# Patient Record
Sex: Female | Born: 1990 | Race: White | Hispanic: No | Marital: Single | Smoking: Never smoker
Health system: Southern US, Community
[De-identification: ages and names within clinical notes are randomized; demographics above are authoritative.]

## PROBLEM LIST (undated history)

## (undated) DIAGNOSIS — S069X9A Unspecified intracranial injury with loss of consciousness of unspecified duration, initial encounter: Secondary | ICD-10-CM

## (undated) DIAGNOSIS — Z8781 Personal history of (healed) traumatic fracture: Secondary | ICD-10-CM

## (undated) DIAGNOSIS — S069XAA Unspecified intracranial injury with loss of consciousness status unknown, initial encounter: Secondary | ICD-10-CM

## (undated) DIAGNOSIS — Z0000 Encounter for general adult medical examination without abnormal findings: Secondary | ICD-10-CM

## (undated) DIAGNOSIS — F909 Attention-deficit hyperactivity disorder, unspecified type: Secondary | ICD-10-CM

---
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
## 2011-06-09 ENCOUNTER — Other Ambulatory Visit: Payer: Self-pay | Admitting: Gastroenterology

## 2011-06-13 ENCOUNTER — Ambulatory Visit
Admission: RE | Admit: 2011-06-13 | Discharge: 2011-06-13 | Disposition: A | Discharge status: Home / Self Care | Payer: BC Managed Care – PPO | Source: Ambulatory Visit | Attending: Gastroenterology | Admitting: Gastroenterology

## 2019-11-16 ENCOUNTER — Emergency Department (HOSPITAL_COMMUNITY): Payer: BC Managed Care – PPO

## 2019-11-16 ENCOUNTER — Inpatient Hospital Stay (HOSPITAL_COMMUNITY)
Admission: EM | Admit: 2019-11-16 | Discharge: 2019-11-27 | DRG: 955 | Disposition: A | Discharge status: Rehabilitation Facility | Payer: BC Managed Care – PPO | Attending: Surgery | Admitting: Surgery

## 2019-11-16 DIAGNOSIS — S0181XA Laceration without foreign body of other part of head, initial encounter: Secondary | ICD-10-CM | POA: Diagnosis present

## 2019-11-16 DIAGNOSIS — S0219XA Other fracture of base of skull, initial encounter for closed fracture: Secondary | ICD-10-CM | POA: Diagnosis present

## 2019-11-16 DIAGNOSIS — S064XAA Epidural hemorrhage with loss of consciousness status unknown, initial encounter: Secondary | ICD-10-CM

## 2019-11-16 DIAGNOSIS — S0281XA Fracture of other specified skull and facial bones, right side, initial encounter for closed fracture: Secondary | ICD-10-CM | POA: Diagnosis present

## 2019-11-16 DIAGNOSIS — G9389 Other specified disorders of brain: Secondary | ICD-10-CM | POA: Diagnosis present

## 2019-11-16 DIAGNOSIS — T1490XA Injury, unspecified, initial encounter: Secondary | ICD-10-CM

## 2019-11-16 DIAGNOSIS — F09 Unspecified mental disorder due to known physiological condition: Secondary | ICD-10-CM | POA: Diagnosis not present

## 2019-11-16 DIAGNOSIS — S062X9A Diffuse traumatic brain injury with loss of consciousness of unspecified duration, initial encounter: Secondary | ICD-10-CM

## 2019-11-16 DIAGNOSIS — R451 Restlessness and agitation: Secondary | ICD-10-CM

## 2019-11-16 DIAGNOSIS — T07XXXA Unspecified multiple injuries, initial encounter: Secondary | ICD-10-CM | POA: Diagnosis not present

## 2019-11-16 DIAGNOSIS — Z298 Encounter for other specified prophylactic measures: Secondary | ICD-10-CM

## 2019-11-16 DIAGNOSIS — J9600 Acute respiratory failure, unspecified whether with hypoxia or hypercapnia: Secondary | ICD-10-CM

## 2019-11-16 DIAGNOSIS — S064X0A Epidural hemorrhage without loss of consciousness, initial encounter: Secondary | ICD-10-CM | POA: Diagnosis present

## 2019-11-16 DIAGNOSIS — T797XXA Traumatic subcutaneous emphysema, initial encounter: Secondary | ICD-10-CM | POA: Diagnosis present

## 2019-11-16 DIAGNOSIS — R402222 Coma scale, best verbal response, incomprehensible words, at arrival to emergency department: Secondary | ICD-10-CM | POA: Diagnosis present

## 2019-11-16 DIAGNOSIS — S061X0A Traumatic cerebral edema without loss of consciousness, initial encounter: Principal | ICD-10-CM | POA: Diagnosis present

## 2019-11-16 DIAGNOSIS — S065X0A Traumatic subdural hemorrhage without loss of consciousness, initial encounter: Secondary | ICD-10-CM | POA: Diagnosis present

## 2019-11-16 DIAGNOSIS — S0292XD Unspecified fracture of facial bones, subsequent encounter for fracture with routine healing: Secondary | ICD-10-CM | POA: Diagnosis not present

## 2019-11-16 DIAGNOSIS — Y92410 Unspecified street and highway as the place of occurrence of the external cause: Secondary | ICD-10-CM | POA: Diagnosis not present

## 2019-11-16 DIAGNOSIS — S06820A Injury of left internal carotid artery, intracranial portion, not elsewhere classified without loss of consciousness, initial encounter: Secondary | ICD-10-CM | POA: Diagnosis present

## 2019-11-16 DIAGNOSIS — S06810A Injury of right internal carotid artery, intracranial portion, not elsewhere classified without loss of consciousness, initial encounter: Secondary | ICD-10-CM | POA: Diagnosis present

## 2019-11-16 DIAGNOSIS — S2231XA Fracture of one rib, right side, initial encounter for closed fracture: Secondary | ICD-10-CM | POA: Diagnosis present

## 2019-11-16 DIAGNOSIS — D696 Thrombocytopenia, unspecified: Secondary | ICD-10-CM | POA: Diagnosis not present

## 2019-11-16 DIAGNOSIS — S022XXA Fracture of nasal bones, initial encounter for closed fracture: Secondary | ICD-10-CM | POA: Diagnosis present

## 2019-11-16 DIAGNOSIS — S02412A LeFort II fracture, initial encounter for closed fracture: Secondary | ICD-10-CM | POA: Diagnosis present

## 2019-11-16 DIAGNOSIS — S27322A Contusion of lung, bilateral, initial encounter: Secondary | ICD-10-CM | POA: Diagnosis present

## 2019-11-16 DIAGNOSIS — D62 Acute posthemorrhagic anemia: Secondary | ICD-10-CM | POA: Diagnosis not present

## 2019-11-16 DIAGNOSIS — Z2989 Encounter for other specified prophylactic measures: Secondary | ICD-10-CM

## 2019-11-16 DIAGNOSIS — S15091A Other specified injury of right carotid artery, initial encounter: Secondary | ICD-10-CM | POA: Diagnosis not present

## 2019-11-16 DIAGNOSIS — F419 Anxiety disorder, unspecified: Secondary | ICD-10-CM | POA: Diagnosis present

## 2019-11-16 DIAGNOSIS — G8918 Other acute postprocedural pain: Secondary | ICD-10-CM | POA: Diagnosis not present

## 2019-11-16 DIAGNOSIS — S0292XA Unspecified fracture of facial bones, initial encounter for closed fracture: Secondary | ICD-10-CM | POA: Diagnosis not present

## 2019-11-16 DIAGNOSIS — S020XXA Fracture of vault of skull, initial encounter for closed fracture: Secondary | ICD-10-CM | POA: Diagnosis present

## 2019-11-16 DIAGNOSIS — S069X0S Unspecified intracranial injury without loss of consciousness, sequela: Secondary | ICD-10-CM | POA: Diagnosis not present

## 2019-11-16 DIAGNOSIS — R4587 Impulsiveness: Secondary | ICD-10-CM | POA: Diagnosis present

## 2019-11-16 DIAGNOSIS — S062X9S Diffuse traumatic brain injury with loss of consciousness of unspecified duration, sequela: Secondary | ICD-10-CM | POA: Diagnosis not present

## 2019-11-16 DIAGNOSIS — Z23 Encounter for immunization: Secondary | ICD-10-CM | POA: Diagnosis not present

## 2019-11-16 DIAGNOSIS — R402112 Coma scale, eyes open, never, at arrival to emergency department: Secondary | ICD-10-CM | POA: Diagnosis present

## 2019-11-16 DIAGNOSIS — Z20822 Contact with and (suspected) exposure to covid-19: Secondary | ICD-10-CM | POA: Diagnosis present

## 2019-11-16 DIAGNOSIS — S064X9A Epidural hemorrhage with loss of consciousness of unspecified duration, initial encounter: Secondary | ICD-10-CM

## 2019-11-16 DIAGNOSIS — S15092A Other specified injury of left carotid artery, initial encounter: Secondary | ICD-10-CM | POA: Diagnosis not present

## 2019-11-16 DIAGNOSIS — S0292XS Unspecified fracture of facial bones, sequela: Secondary | ICD-10-CM | POA: Diagnosis not present

## 2019-11-16 DIAGNOSIS — Z978 Presence of other specified devices: Secondary | ICD-10-CM

## 2019-11-16 DIAGNOSIS — R402352 Coma scale, best motor response, localizes pain, at arrival to emergency department: Secondary | ICD-10-CM | POA: Diagnosis present

## 2019-11-16 DIAGNOSIS — R4689 Other symptoms and signs involving appearance and behavior: Secondary | ICD-10-CM | POA: Diagnosis not present

## 2019-11-16 LAB — I-STAT CHEM 8, ED
BUN: 11 mg/dL (ref 6–20)
Calcium, Ion: 1.13 mmol/L — ABNORMAL LOW (ref 1.15–1.40)
Chloride: 107 mmol/L (ref 98–111)
Creatinine, Ser: 0.6 mg/dL (ref 0.44–1.00)
Glucose, Bld: 153 mg/dL — ABNORMAL HIGH (ref 70–99)
HCT: 38 % (ref 36.0–46.0)
Hemoglobin: 12.9 g/dL (ref 12.0–15.0)
Potassium: 3.2 mmol/L — ABNORMAL LOW (ref 3.5–5.1)
Sodium: 139 mmol/L (ref 135–145)
TCO2: 23 mmol/L (ref 22–32)

## 2019-11-16 LAB — COMPREHENSIVE METABOLIC PANEL
ALT: 20 U/L (ref 0–44)
AST: 30 U/L (ref 15–41)
Albumin: 3.8 g/dL (ref 3.5–5.0)
Alkaline Phosphatase: 49 U/L (ref 38–126)
Anion gap: 11 (ref 5–15)
BUN: 11 mg/dL (ref 6–20)
CO2: 19 mmol/L — ABNORMAL LOW (ref 22–32)
Calcium: 9.2 mg/dL (ref 8.9–10.3)
Chloride: 108 mmol/L (ref 98–111)
Creatinine, Ser: 0.75 mg/dL (ref 0.44–1.00)
GFR calc Af Amer: >60 mL/min (ref >60)
GFR calc non Af Amer: >60 mL/min (ref >60)
Glucose, Bld: 160 mg/dL — ABNORMAL HIGH (ref 70–99)
Potassium: 3.1 mmol/L — ABNORMAL LOW (ref 3.5–5.1)
Sodium: 138 mmol/L (ref 135–145)
Total Bilirubin: 0.5 mg/dL (ref 0.3–1.2)
Total Protein: 6.8 g/dL (ref 6.5–8.1)

## 2019-11-16 LAB — SAMPLE TO BLOOD BANK

## 2019-11-16 LAB — CBC
HCT: 38.9 % (ref 36.0–46.0)
Hemoglobin: 13.3 g/dL (ref 12.0–15.0)
MCH: 29.8 pg (ref 26.0–34.0)
MCHC: 34.2 g/dL (ref 30.0–36.0)
MCV: 87.2 fL (ref 80.0–100.0)
Platelets: 226 10*3/uL (ref 150–400)
RBC: 4.46 MIL/uL (ref 3.87–5.11)
RDW: 12 % (ref 11.5–15.5)
WBC: 10.8 10*3/uL — ABNORMAL HIGH (ref 4.0–10.5)
nRBC: 0 % (ref 0.0–0.2)

## 2019-11-16 LAB — I-STAT BETA HCG BLOOD, ED (MC, WL, AP ONLY): I-stat hCG, quantitative: <5 m[IU]/mL (ref <5)

## 2019-11-16 LAB — PROTIME-INR
INR: 0.9 (ref 0.8–1.2)
Prothrombin Time: 11.7 seconds (ref 11.4–15.2)

## 2019-11-16 LAB — POCT I-STAT 7, (LYTES, BLD GAS, ICA,H+H)
Acid-base deficit: 3 mmol/L — ABNORMAL HIGH (ref 0.0–2.0)
Bicarbonate: 21.2 mmol/L (ref 20.0–28.0)
Calcium, Ion: 1.11 mmol/L — ABNORMAL LOW (ref 1.15–1.40)
HCT: 28 % — ABNORMAL LOW (ref 36.0–46.0)
Hemoglobin: 9.5 g/dL — ABNORMAL LOW (ref 12.0–15.0)
O2 Saturation: 100 %
Patient temperature: 98.6
Potassium: 3 mmol/L — ABNORMAL LOW (ref 3.5–5.1)
Sodium: 137 mmol/L (ref 135–145)
TCO2: 22 mmol/L (ref 22–32)
pCO2 arterial: 34.4 mmHg (ref 32.0–48.0)
pH, Arterial: 7.397 (ref 7.350–7.450)
pO2, Arterial: 466 mmHg — ABNORMAL HIGH (ref 83.0–108.0)

## 2019-11-16 LAB — RESPIRATORY PANEL BY RT PCR (FLU A&B, COVID)
Influenza A by PCR: NEGATIVE
Influenza B by PCR: NEGATIVE
SARS Coronavirus 2 by RT PCR: NEGATIVE

## 2019-11-16 LAB — LACTIC ACID, PLASMA: Lactic Acid, Venous: 1.4 mmol/L (ref 0.5–1.9)

## 2019-11-16 LAB — ETHANOL: Alcohol, Ethyl (B): <10 mg/dL (ref <10)

## 2019-11-16 LAB — CDS SEROLOGY

## 2019-11-16 MED ORDER — PROPOFOL 1000 MG/100ML IV EMUL
5.0000 ug/kg/min | INTRAVENOUS | Status: DC
  Administered 2019-11-17: 40 ug/kg/min via INTRAVENOUS
  Administered 2019-11-17: 50 ug/kg/min via INTRAVENOUS
  Filled 2019-11-16: qty 100

## 2019-11-16 MED ORDER — MIDAZOLAM HCL 2 MG/2ML IJ SOLN
2.0000 mg | INTRAMUSCULAR | Status: DC | PRN
  Filled 2019-11-16: qty 2

## 2019-11-16 MED ORDER — IOHEXOL 300 MG/ML  SOLN
100.0000 mL | Freq: Once | INTRAMUSCULAR | Status: AC | PRN
  Administered 2019-11-16: 100 mL via INTRAVENOUS

## 2019-11-16 MED ORDER — MIDAZOLAM HCL 2 MG/2ML IJ SOLN: 2.0000 mg | INTRAMUSCULAR | Status: DC | PRN

## 2019-11-16 MED ORDER — IOHEXOL 350 MG/ML SOLN
75.0000 mL | Freq: Once | INTRAVENOUS | Status: AC | PRN
  Administered 2019-11-16: 75 mL via INTRAVENOUS

## 2019-11-16 MED ORDER — LIDOCAINE-EPINEPHRINE (PF) 1 %-1:200000 IJ SOLN
20.0000 mL | Freq: Once | INTRAMUSCULAR | Status: DC
  Filled 2019-11-16: qty 20

## 2019-11-16 MED ORDER — FENTANYL CITRATE (PF) 100 MCG/2ML IJ SOLN
100.0000 ug | INTRAMUSCULAR | Status: DC | PRN
  Administered 2019-11-16 (×2): 100 ug via INTRAVENOUS
  Filled 2019-11-16: qty 2

## 2019-11-16 MED ORDER — FENTANYL CITRATE (PF) 100 MCG/2ML IJ SOLN
100.0000 ug | INTRAMUSCULAR | Status: DC | PRN
  Filled 2019-11-16: qty 2

## 2019-11-16 MED ORDER — MIDAZOLAM HCL 2 MG/2ML IJ SOLN
INTRAMUSCULAR | Status: AC
  Administered 2019-11-16: 1 mg
  Filled 2019-11-16: qty 2

## 2019-11-16 MED ORDER — FENTANYL CITRATE (PF) 100 MCG/2ML IJ SOLN
INTRAMUSCULAR | Status: AC
  Filled 2019-11-16: qty 2

## 2019-11-16 MED ORDER — SODIUM CHLORIDE 0.9 % IV SOLN
INTRAVENOUS | Status: AC | PRN
  Administered 2019-11-16: 1000 mL via INTRAVENOUS

## 2019-11-16 MED ORDER — SODIUM CHLORIDE 0.9 % IV SOLN
2.0000 g | Freq: Once | INTRAVENOUS | Status: AC
  Administered 2019-11-16: 2 g via INTRAVENOUS

## 2019-11-16 MED ORDER — LIDOCAINE-EPINEPHRINE (PF) 1.5 %-1:200000 IJ SOLN
20.0000 mL | Freq: Once | INTRAMUSCULAR | Status: AC
  Administered 2019-11-16: 20 mL
  Filled 2019-11-16: qty 30

## 2019-11-16 MED ORDER — TETANUS-DIPHTH-ACELL PERTUSSIS 5-2.5-18.5 LF-MCG/0.5 IM SUSP
0.5000 mL | Freq: Once | INTRAMUSCULAR | Status: AC
  Administered 2019-11-16: 0.5 mL via INTRAMUSCULAR

## 2019-11-16 MED ORDER — PROPOFOL 1000 MG/100ML IV EMUL
5.0000 ug/kg/min | INTRAVENOUS | Status: DC
  Administered 2019-11-16: 20 ug/kg/min via INTRAVENOUS
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
## 2019-11-16 NOTE — Progress Notes (Signed)
Orthopedic Tech Progress Note Patient Details:  Central Cc Doe 11/21/1875 [id number] TRAUMA lvl 2 Patient ID: Central Cc Doe, female   DOB: 11/21/1875, 28 y.o.   MRN: [id number]   Staci Righter 11/16/2019, 8:04 PM
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
## 2019-11-16 NOTE — ED Notes (Signed)
Portable chest bedside after intubation.

## 2019-11-16 NOTE — ED Notes (Signed)
Called Respiratory, she continues to be busy, will see if someone else is available.  Provider aware.

## 2019-11-16 NOTE — ED Notes (Signed)
POC Covid test error 19 test could not run. Sample bloody.
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
## 2019-11-16 NOTE — ED Notes (Signed)
RN, TRN and respiratory return from CT
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
## 2019-11-16 NOTE — ED Notes (Signed)
Requested secretary call for plastics tray from OR
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
## 2019-11-16 NOTE — ED Notes (Signed)
Called CT and respiratory, provider ordered new CT.  CT ready, waiting on respiratory to go to CT
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
## 2019-11-16 NOTE — ED Notes (Signed)
Patient transported to CT 
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
## 2019-11-17 ENCOUNTER — Inpatient Hospital Stay (HOSPITAL_COMMUNITY): Payer: BC Managed Care – PPO

## 2019-11-17 DIAGNOSIS — S15092A Other specified injury of left carotid artery, initial encounter: Secondary | ICD-10-CM

## 2019-11-17 DIAGNOSIS — S15091A Other specified injury of right carotid artery, initial encounter: Secondary | ICD-10-CM

## 2019-11-17 LAB — BASIC METABOLIC PANEL
Anion gap: 13 (ref 5–15)
BUN: 9 mg/dL (ref 6–20)
CO2: 18 mmol/L — ABNORMAL LOW (ref 22–32)
Calcium: 7.9 mg/dL — ABNORMAL LOW (ref 8.9–10.3)
Chloride: 108 mmol/L (ref 98–111)
Creatinine, Ser: 0.72 mg/dL (ref 0.44–1.00)
GFR calc Af Amer: >60 mL/min (ref >60)
GFR calc non Af Amer: >60 mL/min (ref >60)
Glucose, Bld: 99 mg/dL (ref 70–99)
Potassium: 3.7 mmol/L (ref 3.5–5.1)
Sodium: 139 mmol/L (ref 135–145)

## 2019-11-17 LAB — RAPID URINE DRUG SCREEN, HOSP PERFORMED
Amphetamines: NOT DETECTED
Barbiturates: NOT DETECTED
Benzodiazepines: POSITIVE — AB
Cocaine: NOT DETECTED
Opiates: NOT DETECTED
Tetrahydrocannabinol: NOT DETECTED

## 2019-11-17 LAB — CBC
HCT: 30.7 % — ABNORMAL LOW (ref 36.0–46.0)
Hemoglobin: 10.7 g/dL — ABNORMAL LOW (ref 12.0–15.0)
MCH: 30.5 pg (ref 26.0–34.0)
MCHC: 34.9 g/dL (ref 30.0–36.0)
MCV: 87.5 fL (ref 80.0–100.0)
Platelets: 139 10*3/uL — ABNORMAL LOW (ref 150–400)
RBC: 3.51 MIL/uL — ABNORMAL LOW (ref 3.87–5.11)
RDW: 12.2 % (ref 11.5–15.5)
WBC: 14.3 10*3/uL — ABNORMAL HIGH (ref 4.0–10.5)
nRBC: 0 % (ref 0.0–0.2)

## 2019-11-17 LAB — URINALYSIS, ROUTINE W REFLEX MICROSCOPIC
Bilirubin Urine: NEGATIVE
Glucose, UA: NEGATIVE mg/dL
Hgb urine dipstick: NEGATIVE
Ketones, ur: 5 mg/dL — AB
Leukocytes,Ua: NEGATIVE
Nitrite: NEGATIVE
Protein, ur: NEGATIVE mg/dL
Specific Gravity, Urine: 1.032 — ABNORMAL HIGH (ref 1.005–1.030)
pH: 6 (ref 5.0–8.0)

## 2019-11-17 LAB — HIV ANTIBODY (ROUTINE TESTING W REFLEX): HIV Screen 4th Generation wRfx: NONREACTIVE

## 2019-11-17 LAB — TRIGLYCERIDES: Triglycerides: 163 mg/dL — ABNORMAL HIGH (ref <150)

## 2019-11-17 MED ORDER — LACTATED RINGERS IV SOLN: INTRAVENOUS | Status: DC

## 2019-11-17 MED ORDER — CHLORHEXIDINE GLUCONATE 0.12% ORAL RINSE (MEDLINE KIT)
15.0000 mL | Freq: Two times a day (BID) | OROMUCOSAL | Status: DC
  Administered 2019-11-17 – 2019-11-18 (×3): 15 mL via OROMUCOSAL

## 2019-11-17 MED ORDER — ORAL CARE MOUTH RINSE
15.0000 mL | OROMUCOSAL | Status: DC
  Administered 2019-11-17 (×6): 15 mL via OROMUCOSAL

## 2019-11-17 MED ORDER — DOCUSATE SODIUM 50 MG/5ML PO LIQD: 100.0000 mg | Freq: Two times a day (BID) | ORAL | Status: DC | PRN

## 2019-11-17 MED ORDER — BISACODYL 10 MG RE SUPP: 10.0000 mg | Freq: Every day | RECTAL | Status: DC | PRN

## 2019-11-17 MED ORDER — FENTANYL BOLUS VIA INFUSION
50.0000 ug | INTRAVENOUS | Status: DC | PRN
  Filled 2019-11-17: qty 50

## 2019-11-17 MED ORDER — CHLORHEXIDINE GLUCONATE CLOTH 2 % EX PADS
6.0000 | MEDICATED_PAD | Freq: Every day | CUTANEOUS | Status: DC
  Administered 2019-11-18: 6 via TOPICAL

## 2019-11-17 MED ORDER — ONDANSETRON 4 MG PO TBDP: 4.0000 mg | ORAL_TABLET | Freq: Four times a day (QID) | ORAL | Status: DC | PRN

## 2019-11-17 MED ORDER — PROPOFOL 1000 MG/100ML IV EMUL: 0.0000 ug/kg/min | INTRAVENOUS | Status: DC

## 2019-11-17 MED ORDER — LEVETIRACETAM IN NACL 500 MG/100ML IV SOLN
500.0000 mg | Freq: Two times a day (BID) | INTRAVENOUS | Status: DC
  Administered 2019-11-17 – 2019-11-23 (×13): 500 mg via INTRAVENOUS
  Filled 2019-11-17 (×13): qty 100

## 2019-11-17 MED ORDER — METHOCARBAMOL 1000 MG/10ML IJ SOLN
1000.0000 mg | Freq: Three times a day (TID) | INTRAVENOUS | Status: DC
  Administered 2019-11-17 – 2019-11-23 (×18): 1000 mg via INTRAVENOUS
  Filled 2019-11-17 (×22): qty 10

## 2019-11-17 MED ORDER — MORPHINE SULFATE (PF) 4 MG/ML IV SOLN
4.0000 mg | INTRAVENOUS | Status: DC | PRN
  Administered 2019-11-19 – 2019-11-23 (×4): 4 mg via INTRAVENOUS
  Filled 2019-11-17 (×4): qty 1

## 2019-11-17 MED ORDER — ACETAMINOPHEN 160 MG/5ML PO SOLN
1000.0000 mg | Freq: Four times a day (QID) | ORAL | Status: DC
  Administered 2019-11-17 (×3): 1000 mg
  Filled 2019-11-17 (×3): qty 40.6

## 2019-11-17 MED ORDER — FENTANYL 2500MCG IN NS 250ML (10MCG/ML) PREMIX INFUSION
50.0000 ug/h | INTRAVENOUS | Status: DC
  Administered 2019-11-17: 100 ug/h via INTRAVENOUS

## 2019-11-17 MED ORDER — OXYCODONE HCL 5 MG/5ML PO SOLN
5.0000 mg | ORAL | Status: DC | PRN
  Administered 2019-11-20 – 2019-11-24 (×3): 5 mg
  Filled 2019-11-17 (×3): qty 5

## 2019-11-17 MED ORDER — DEXMEDETOMIDINE HCL IN NACL 400 MCG/100ML IV SOLN
0.0000 ug/kg/h | INTRAVENOUS | Status: DC
  Administered 2019-11-17: 0.4 ug/kg/h via INTRAVENOUS
  Administered 2019-11-18 (×2): 0.6 ug/kg/h via INTRAVENOUS
  Administered 2019-11-19: 0.3 ug/kg/h via INTRAVENOUS
  Administered 2019-11-19: 0.5 ug/kg/h via INTRAVENOUS
  Administered 2019-11-20: 0.8 ug/kg/h via INTRAVENOUS
  Filled 2019-11-17 (×5): qty 100

## 2019-11-17 MED ORDER — FENTANYL CITRATE (PF) 100 MCG/2ML IJ SOLN: 50.0000 ug | Freq: Once | INTRAMUSCULAR | Status: DC

## 2019-11-17 MED ORDER — ONDANSETRON HCL 4 MG/2ML IJ SOLN: 4.0000 mg | Freq: Four times a day (QID) | INTRAMUSCULAR | Status: DC | PRN

## 2019-11-17 MED ORDER — CHLORHEXIDINE GLUCONATE CLOTH 2 % EX PADS: 6.0000 | MEDICATED_PAD | Freq: Every day | CUTANEOUS | Status: DC

## 2019-11-17 MED ORDER — LACTATED RINGERS IV BOLUS
500.0000 mL | Freq: Once | INTRAVENOUS | Status: AC
  Administered 2019-11-17: 500 mL via INTRAVENOUS

## 2019-11-17 NOTE — ED Notes (Signed)
Family updated as to patient's status.

## 2019-11-17 NOTE — Progress Notes (Signed)
MD gave a verbal order to RN that it is now ok to monitor ICP q52minutes at this point
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
## 2019-11-17 NOTE — Procedures (Signed)
Extubation Procedure Note  Patient Details:   Name: Maria Daniels DOB: 02/18/1991 MRN: [id number]   Airway Documentation:    Vent end date: 11/17/19 Vent end time: 1425   Evaluation  O2 sats: stable throughout Complications: No apparent complications Patient did tolerate procedure well. Bilateral Breath Sounds: Clear, Diminished   Patient extubated to 4L [place] per MD order. Patient able to cough & speak post extubation. Patient is very agitated at this time.  Kathie Dike 11/17/2019, 2:30 PM

## 2019-11-17 NOTE — ED Notes (Signed)
Family at beside. Family given emotional support. 
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
## 2019-11-17 NOTE — Progress Notes (Signed)
Patient transported to CT and back to 4N16 without complications.  

## 2019-11-17 NOTE — Progress Notes (Signed)
TRN note Pt following commands per primary RN, resting with eyes closed at this time, RR even and unlabored, plan to extubate today. Please call TRN for any assistance.  Christie Viscomi,TRN [phone]
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
## 2019-11-18 LAB — CBC
HCT: 26.4 % — ABNORMAL LOW (ref 36.0–46.0)
Hemoglobin: 9.1 g/dL — ABNORMAL LOW (ref 12.0–15.0)
MCH: 29.8 pg (ref 26.0–34.0)
MCHC: 34.5 g/dL (ref 30.0–36.0)
MCV: 86.6 fL (ref 80.0–100.0)
Platelets: 113 10*3/uL — ABNORMAL LOW (ref 150–400)
RBC: 3.05 MIL/uL — ABNORMAL LOW (ref 3.87–5.11)
RDW: 12.5 % (ref 11.5–15.5)
WBC: 7.3 10*3/uL (ref 4.0–10.5)
nRBC: 0 % (ref 0.0–0.2)

## 2019-11-18 LAB — BASIC METABOLIC PANEL
Anion gap: 12 (ref 5–15)
BUN: <5 mg/dL — ABNORMAL LOW (ref 6–20)
CO2: 19 mmol/L — ABNORMAL LOW (ref 22–32)
Calcium: 8.3 mg/dL — ABNORMAL LOW (ref 8.9–10.3)
Chloride: 106 mmol/L (ref 98–111)
Creatinine, Ser: 0.65 mg/dL (ref 0.44–1.00)
GFR calc Af Amer: >60 mL/min (ref >60)
GFR calc non Af Amer: >60 mL/min (ref >60)
Glucose, Bld: 131 mg/dL — ABNORMAL HIGH (ref 70–99)
Potassium: 3.2 mmol/L — ABNORMAL LOW (ref 3.5–5.1)
Sodium: 137 mmol/L (ref 135–145)

## 2019-11-18 LAB — PHOSPHORUS: Phosphorus: 2.9 mg/dL (ref 2.5–4.6)

## 2019-11-18 LAB — MAGNESIUM: Magnesium: 1.7 mg/dL (ref 1.7–2.4)

## 2019-11-18 MED ORDER — CEFAZOLIN SODIUM-DEXTROSE 1-4 GM/50ML-% IV SOLN
1.0000 g | Freq: Three times a day (TID) | INTRAVENOUS | Status: DC
  Administered 2019-11-18 – 2019-11-20 (×6): 1 g via INTRAVENOUS
  Filled 2019-11-18 (×7): qty 50

## 2019-11-18 MED ORDER — CEFAZOLIN SODIUM 1 G IJ SOLR: 1.0000 g | Freq: Three times a day (TID) | INTRAMUSCULAR | Status: DC

## 2019-11-18 MED ORDER — CHLORHEXIDINE GLUCONATE CLOTH 2 % EX PADS
6.0000 | MEDICATED_PAD | Freq: Every day | CUTANEOUS | Status: DC
  Administered 2019-11-19 – 2019-11-23 (×5): 6 via TOPICAL

## 2019-11-18 MED ORDER — CHLORHEXIDINE GLUCONATE 0.12 % MT SOLN
15.0000 mL | Freq: Two times a day (BID) | OROMUCOSAL | Status: DC
  Administered 2019-11-18 – 2019-11-27 (×14): 15 mL via OROMUCOSAL
  Filled 2019-11-18 (×13): qty 15

## 2019-11-18 MED ORDER — POTASSIUM CHLORIDE 10 MEQ/100ML IV SOLN
10.0000 meq | INTRAVENOUS | Status: AC
  Administered 2019-11-18 (×4): 10 meq via INTRAVENOUS
  Filled 2019-11-18 (×4): qty 100

## 2019-11-18 MED ORDER — ORAL CARE MOUTH RINSE
15.0000 mL | Freq: Two times a day (BID) | OROMUCOSAL | Status: DC
  Administered 2019-11-19 – 2019-11-24 (×8): 15 mL via OROMUCOSAL

## 2019-11-18 NOTE — Progress Notes (Signed)
[facility]   Subjective  - extubated, slightly agitated, no neurologic deficits   Objective 117/64 (!) 111 99.2 F (37.3 C) (Oral) (!) 27 99%  Intake/Output Summary (Last 24 hours) at 11/18/2019 1651 Last data filed at 11/18/2019 1600 Gross per 24 hour  Intake 3221.17 ml  Output 3087 ml  Net 134.17 ml    Moving all extremities No neuro deficits appreciable  Laboratory Lab Results: Recent Labs    11/17/19 0253 11/18/19 0613  WBC 14.3* 7.3  HGB 10.7* 9.1*  HCT 30.7* 26.4*  PLT 139* 113*   BMET Recent Labs    11/17/19 0046 11/18/19 0613  NA 139 137  K 3.7 3.2*  CL 108 106  CO2 18* 19*  GLUCOSE 99 131*  BUN 9 <5*  CREATININE 0.72 0.65  CALCIUM 7.9* 8.3*    COAG Lab Results  Component Value Date   INR 0.9 11/16/2019   No results found for: PTT  Assessment/Planning:  28 year old female with concern for 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  Again did not see any dissection or intimal defect on CTA with good filling distally.  She is neurologically intact otherwise.  81 mg aspirin when safe from a neurosurgery standpoint.  Marty Heck 11/18/2019 4:51 PM --
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
## 2019-11-18 NOTE — Plan of Care (Signed)
  Problem: Clinical Measurements: Goal: Respiratory complications will improve Outcome: Progressing Goal: Cardiovascular complication will be avoided Outcome: Progressing   Problem: Safety: Goal: Ability to remain free from injury will improve Outcome: Progressing   Problem: Activity: Goal: Risk for activity intolerance will decrease Outcome: Not Progressing   Problem: Nutrition: Goal: Adequate nutrition will be maintained Outcome: Not Progressing  Pt unable to work with PT due to bedrest order. Still NPO at this time
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
## 2019-11-18 NOTE — Progress Notes (Signed)
OT Cancellation Note  Patient Details Name: Maria Daniels MRN: [id number] DOB: 05-Apr-1991   Cancelled Treatment:    Reason Eval/Treat Not Completed: Pt agitated and pulled out IVs. She is in process of having IVs reinserted, and agitation has increased.  Will try back.   Nilsa Nutting., OTR/L Acute Rehabilitation Services Pager [phone] Office [phone]   Lucille Passy M 11/18/2019, 4:09 PM
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
## 2019-11-18 NOTE — Progress Notes (Signed)
SLP Cancellation Note  Patient Details Name: [name] MRN: [id number] DOB: 18-Aug-1991   Cancelled treatment:       Reason Eval/Treat Not Completed: Patient unavailable, with nursing, getting lines replaced. Will return next date for swallow evaluation.  Rojelio Uhrich L. Tivis Ringer, [place] CCC/SLP Acute Rehabilitation Services Office number [phone] Pager [phone]    Juan Quam Laurice 11/18/2019, 3:59 PM

## 2019-11-19 LAB — CBC
HCT: 25.9 % — ABNORMAL LOW (ref 36.0–46.0)
Hemoglobin: 8.6 g/dL — ABNORMAL LOW (ref 12.0–15.0)
MCH: 30 pg (ref 26.0–34.0)
MCHC: 33.2 g/dL (ref 30.0–36.0)
MCV: 90.2 fL (ref 80.0–100.0)
Platelets: 109 10*3/uL — ABNORMAL LOW (ref 150–400)
RBC: 2.87 MIL/uL — ABNORMAL LOW (ref 3.87–5.11)
RDW: 12.8 % (ref 11.5–15.5)
WBC: 5.4 10*3/uL (ref 4.0–10.5)
nRBC: 0 % (ref 0.0–0.2)

## 2019-11-19 LAB — MAGNESIUM: Magnesium: 2.1 mg/dL (ref 1.7–2.4)

## 2019-11-19 LAB — BASIC METABOLIC PANEL
Anion gap: 9 (ref 5–15)
BUN: <5 mg/dL — ABNORMAL LOW (ref 6–20)
CO2: 22 mmol/L (ref 22–32)
Calcium: 8.4 mg/dL — ABNORMAL LOW (ref 8.9–10.3)
Chloride: 115 mmol/L — ABNORMAL HIGH (ref 98–111)
Creatinine, Ser: 0.62 mg/dL (ref 0.44–1.00)
GFR calc Af Amer: >60 mL/min (ref >60)
GFR calc non Af Amer: >60 mL/min (ref >60)
Glucose, Bld: 128 mg/dL — ABNORMAL HIGH (ref 70–99)
Potassium: 3.7 mmol/L (ref 3.5–5.1)
Sodium: 146 mmol/L — ABNORMAL HIGH (ref 135–145)

## 2019-11-19 LAB — PHOSPHORUS: Phosphorus: 3 mg/dL (ref 2.5–4.6)

## 2019-11-19 MED ORDER — BOOST / RESOURCE BREEZE PO LIQD CUSTOM
1.0000 | Freq: Three times a day (TID) | ORAL | Status: DC
  Administered 2019-11-19: 1 via ORAL

## 2019-11-19 MED ORDER — ACETAMINOPHEN 500 MG PO TABS
1000.0000 mg | ORAL_TABLET | Freq: Four times a day (QID) | ORAL | Status: DC
  Administered 2019-11-20 – 2019-11-24 (×18): 1000 mg via ORAL
  Filled 2019-11-19 (×15): qty 2

## 2019-11-19 MED ORDER — ACETAMINOPHEN 160 MG/5ML PO SOLN: 1000.0000 mg | Freq: Four times a day (QID) | ORAL | Status: DC

## 2019-11-19 MED ORDER — RESOURCE THICKENUP CLEAR PO POWD
ORAL | Status: DC | PRN
  Filled 2019-11-19 (×2): qty 125
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
## 2019-11-19 NOTE — Plan of Care (Signed)
  Problem: Nutrition: Goal: Adequate nutrition will be maintained Outcome: Progressing   Problem: Coping: Goal: Level of anxiety will decrease Outcome: Progressing   Problem: Elimination: Goal: Will not experience complications related to urinary retention Outcome: Progressing   Problem: Pain Managment: Goal: General experience of comfort will improve Outcome: Progressing   Problem: Clinical Measurements: Goal: Neurologic status will improve Outcome: Progressing   Problem: Respiratory: Goal: Will regain and/or maintain adequate ventilation Outcome: Progressing   Problem: Skin Integrity: Goal: Risk for impaired skin integrity will decrease Outcome: Progressing   Problem: Nutritional: Goal: Dietary intake will improve Outcome: Progressing   Problem: Activity: Goal: Risk for activity intolerance will decrease Outcome: Not Progressing Pt still on bedrest at this time
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
## 2019-11-20 ENCOUNTER — Inpatient Hospital Stay (HOSPITAL_COMMUNITY): Payer: BC Managed Care – PPO

## 2019-11-20 ENCOUNTER — Encounter (HOSPITAL_COMMUNITY): Payer: Self-pay

## 2019-11-20 DIAGNOSIS — G8918 Other acute postprocedural pain: Secondary | ICD-10-CM

## 2019-11-20 DIAGNOSIS — D696 Thrombocytopenia, unspecified: Secondary | ICD-10-CM

## 2019-11-20 DIAGNOSIS — D62 Acute posthemorrhagic anemia: Secondary | ICD-10-CM

## 2019-11-20 DIAGNOSIS — S062X9A Diffuse traumatic brain injury with loss of consciousness of unspecified duration, initial encounter: Secondary | ICD-10-CM

## 2019-11-20 DIAGNOSIS — S0292XA Unspecified fracture of facial bones, initial encounter for closed fracture: Secondary | ICD-10-CM

## 2019-11-20 DIAGNOSIS — T07XXXA Unspecified multiple injuries, initial encounter: Secondary | ICD-10-CM

## 2019-11-20 LAB — BASIC METABOLIC PANEL
Anion gap: 9 (ref 5–15)
BUN: 5 mg/dL — ABNORMAL LOW (ref 6–20)
CO2: 23 mmol/L (ref 22–32)
Calcium: 8.7 mg/dL — ABNORMAL LOW (ref 8.9–10.3)
Chloride: 111 mmol/L (ref 98–111)
Creatinine, Ser: 0.68 mg/dL (ref 0.44–1.00)
GFR calc Af Amer: >60 mL/min (ref >60)
GFR calc non Af Amer: >60 mL/min (ref >60)
Glucose, Bld: 110 mg/dL — ABNORMAL HIGH (ref 70–99)
Potassium: 3.6 mmol/L (ref 3.5–5.1)
Sodium: 143 mmol/L (ref 135–145)

## 2019-11-20 LAB — CBC
HCT: 28.3 % — ABNORMAL LOW (ref 36.0–46.0)
Hemoglobin: 9.3 g/dL — ABNORMAL LOW (ref 12.0–15.0)
MCH: 30 pg (ref 26.0–34.0)
MCHC: 32.9 g/dL (ref 30.0–36.0)
MCV: 91.3 fL (ref 80.0–100.0)
Platelets: 125 10*3/uL — ABNORMAL LOW (ref 150–400)
RBC: 3.1 MIL/uL — ABNORMAL LOW (ref 3.87–5.11)
RDW: 12.5 % (ref 11.5–15.5)
WBC: 5.3 10*3/uL (ref 4.0–10.5)
nRBC: 0 % (ref 0.0–0.2)

## 2019-11-20 LAB — MAGNESIUM: Magnesium: 2.2 mg/dL (ref 1.7–2.4)

## 2019-11-20 LAB — PHOSPHORUS: Phosphorus: 2.4 mg/dL — ABNORMAL LOW (ref 2.5–4.6)

## 2019-11-20 MED ORDER — ENSURE ENLIVE PO LIQD
237.0000 mL | Freq: Two times a day (BID) | ORAL | Status: DC
  Administered 2019-11-21: 237 mL via ORAL

## 2019-11-20 MED ORDER — LORAZEPAM 2 MG/ML IJ SOLN
1.0000 mg | Freq: Once | INTRAMUSCULAR | Status: AC
  Administered 2019-11-20: 1 mg via INTRAVENOUS
  Filled 2019-11-20: qty 1

## 2019-11-20 MED ORDER — POTASSIUM PHOSPHATES 15 MMOLE/5ML IV SOLN
30.0000 mmol | Freq: Once | INTRAVENOUS | Status: AC
  Administered 2019-11-20: 30 mmol via INTRAVENOUS
  Filled 2019-11-20: qty 10

## 2019-11-20 MED ORDER — ASPIRIN EC 81 MG PO TBEC
81.0000 mg | DELAYED_RELEASE_TABLET | Freq: Every day | ORAL | Status: DC
  Administered 2019-11-20 – 2019-11-24 (×5): 81 mg via ORAL
  Filled 2019-11-20 (×5): qty 1

## 2019-11-20 MED ORDER — AMPHETAMINE-DEXTROAMPHETAMINE 10 MG PO TABS: 30.0000 mg | ORAL_TABLET | Freq: Two times a day (BID) | ORAL | Status: DC | PRN
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
## 2019-11-20 NOTE — Progress Notes (Signed)
Rehab Admissions Coordinator Note:  Per OT and SLP recommendation, this patient was screened by Raechel Ache for appropriateness for an Inpatient Acute Rehab Consult.  At this time, we are recommending Inpatient Rehab consult. AC will contact MD to request order.   Raechel Ache 11/20/2019, 8:25 AM  I can be reached at [phone].

## 2019-11-20 NOTE — Progress Notes (Signed)
Inpatient Rehab Admissions:  Inpatient Rehab Consult received.  I met with patient at the bedside for rehabilitation assessment and to discuss goals and expectations of an inpatient rehab admission.  She is a little internally distracted, but overall able to engage in simple conversation with me.  We discussed rehab and she was agreeable.  I was also able to meet with her parents Maria Daniels and Dr. Mayer Camel) to discuss goals/expectations of CIR admission.  I explained insurance authorization process, ELOS, goals, and timing of potential admission.  I answered questions and let them know I would follow along and touch base with them next week following facial surgery.    Signed: Shann Medal, PT, DPT Admissions Coordinator [phone] 11/20/19  4:44 PM
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
## 2019-11-20 NOTE — Progress Notes (Signed)
Pt with restlessness and periods of agitation despite increased Precedex dose, worse with stimulation. During agitated episodes tries to grab EVD. Dr. Barry Dienes paged, verbal order received for 1mg  of Ativan so pt can tolerate CT scan.
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
## 2019-11-21 MED ORDER — LORAZEPAM 2 MG/ML IJ SOLN
INTRAMUSCULAR | Status: AC
  Administered 2019-11-21: 1 mg via INTRAVENOUS
  Filled 2019-11-21: qty 1

## 2019-11-21 MED ORDER — QUETIAPINE FUMARATE 50 MG PO TABS
50.0000 mg | ORAL_TABLET | Freq: Two times a day (BID) | ORAL | Status: DC
  Administered 2019-11-21 – 2019-11-27 (×11): 50 mg via ORAL
  Filled 2019-11-21 (×2): qty 2
  Filled 2019-11-21: qty 1
  Filled 2019-11-21: qty 2
  Filled 2019-11-21: qty 1
  Filled 2019-11-21 (×2): qty 2
  Filled 2019-11-21 (×2): qty 1
  Filled 2019-11-21: qty 2
  Filled 2019-11-21: qty 1

## 2019-11-21 MED ORDER — LORAZEPAM 2 MG/ML IJ SOLN: 1.0000 mg | INTRAMUSCULAR | Status: DC | PRN
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
## 2019-11-22 MED ORDER — SODIUM CHLORIDE 0.9 % IV SOLN
INTRAVENOUS | Status: DC | PRN
  Administered 2019-11-22: 500 mL via INTRAVENOUS

## 2019-11-22 MED ORDER — PHOSPHA 250 NEUTRAL 155-852-130 MG PO TABS
500.0000 mg | ORAL_TABLET | Freq: Once | ORAL | Status: AC
  Administered 2019-11-22: 500 mg via ORAL
  Filled 2019-11-22: qty 2

## 2019-11-22 MED ORDER — INFLUENZA VAC SPLIT QUAD 0.5 ML IM SUSY
0.5000 mL | PREFILLED_SYRINGE | INTRAMUSCULAR | Status: AC
  Administered 2019-11-23: 0.5 mL via INTRAMUSCULAR
  Filled 2019-11-22: qty 0.5

## 2019-11-22 MED ORDER — CLONAZEPAM 0.5 MG PO TABS
0.5000 mg | ORAL_TABLET | Freq: Every day | ORAL | Status: DC
  Administered 2019-11-22 – 2019-11-24 (×3): 0.5 mg via ORAL
  Filled 2019-11-22 (×3): qty 1

## 2019-11-22 MED ORDER — POTASSIUM PHOSPHATE MONOBASIC 500 MG PO TABS
500.0000 mg | ORAL_TABLET | Freq: Once | ORAL | Status: DC
  Filled 2019-11-22: qty 1

## 2019-11-22 NOTE — Progress Notes (Signed)
BP 117/71   Pulse (!) 58   Temp 98.7 F (37.1 C) (Oral)   Resp (!) 27   Ht 5\' 10"  (1.778 m)   Wt 70 kg   SpO2 97%   BMI 22.14 kg/m  Lethargic, easily arouses to voice. Stated she was 29 years old. Following commands No drift Left pupil round, regular reactive to light Moving all extremities Improving neurologically
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
## 2019-11-23 LAB — CBC
HCT: 29.8 % — ABNORMAL LOW (ref 36.0–46.0)
Hemoglobin: 10.5 g/dL — ABNORMAL LOW (ref 12.0–15.0)
MCH: 29.5 pg (ref 26.0–34.0)
MCHC: 35.2 g/dL (ref 30.0–36.0)
MCV: 83.7 fL (ref 80.0–100.0)
Platelets: 191 10*3/uL (ref 150–400)
RBC: 3.56 MIL/uL — ABNORMAL LOW (ref 3.87–5.11)
RDW: 12.1 % (ref 11.5–15.5)
WBC: 5.7 10*3/uL (ref 4.0–10.5)
nRBC: 0 % (ref 0.0–0.2)

## 2019-11-23 LAB — BASIC METABOLIC PANEL
Anion gap: 12 (ref 5–15)
BUN: 6 mg/dL (ref 6–20)
CO2: 23 mmol/L (ref 22–32)
Calcium: 8.6 mg/dL — ABNORMAL LOW (ref 8.9–10.3)
Chloride: 100 mmol/L (ref 98–111)
Creatinine, Ser: 0.57 mg/dL (ref 0.44–1.00)
GFR calc Af Amer: >60 mL/min (ref >60)
GFR calc non Af Amer: >60 mL/min (ref >60)
Glucose, Bld: 98 mg/dL (ref 70–99)
Potassium: 3.2 mmol/L — ABNORMAL LOW (ref 3.5–5.1)
Sodium: 135 mmol/L (ref 135–145)

## 2019-11-23 LAB — PHOSPHORUS: Phosphorus: 4.5 mg/dL (ref 2.5–4.6)

## 2019-11-23 LAB — MAGNESIUM: Magnesium: 2 mg/dL (ref 1.7–2.4)

## 2019-11-23 MED ORDER — METHOCARBAMOL 500 MG PO TABS
1000.0000 mg | ORAL_TABLET | Freq: Three times a day (TID) | ORAL | Status: DC
  Administered 2019-11-23 – 2019-11-27 (×10): 1000 mg via ORAL
  Filled 2019-11-23 (×11): qty 2

## 2019-11-23 MED ORDER — POTASSIUM CHLORIDE CRYS ER 20 MEQ PO TBCR
40.0000 meq | EXTENDED_RELEASE_TABLET | Freq: Once | ORAL | Status: AC
  Administered 2019-11-23: 40 meq via ORAL
  Filled 2019-11-23: qty 2

## 2019-11-23 MED ORDER — LEVETIRACETAM 500 MG PO TABS
500.0000 mg | ORAL_TABLET | Freq: Two times a day (BID) | ORAL | Status: DC
  Administered 2019-11-23 – 2019-11-24 (×3): 500 mg via ORAL
  Filled 2019-11-23 (×3): qty 1

## 2019-11-23 NOTE — Progress Notes (Signed)
Subjective: Patient reports increased headache.  No nausea and vomiting.  Objective: Vital signs in last 24 hours: Temp:  [98.7 F (37.1 C)-98.8 F (37.1 C)] 98.8 F (37.1 C) (01/02 0400) Resp:  [10-27] 12 (01/02 0800) BP: (102-122)/(63-89) 105/66 (01/02 0600) Weight:  [65.9 kg] 65.9 kg (01/01 1500)  Intake/Output from previous day: 01/01 0701 - 01/02 0700 In: 643.3 [P.O.:75; I.V.:38.5; IV Piggyback:529.9] Out: 325 [Urine:325] Intake/Output this shift: Total I/O In: 240 [P.O.:240] Out: -   Awake, follows commands and moves all 4 extremities, states name and situation.  Lab Results: Lab Results  Component Value Date   WBC 5.7 11/23/2019   HGB 10.5 (L) 11/23/2019   HCT 29.8 (L) 11/23/2019   MCV 83.7 11/23/2019   PLT 191 11/23/2019   Lab Results  Component Value Date   INR 0.9 11/16/2019   BMET Lab Results  Component Value Date   NA 135 11/23/2019   K 3.2 (L) 11/23/2019   CL 100 11/23/2019   CO2 23 11/23/2019   GLUCOSE 98 11/23/2019   BUN 6 11/23/2019   CREATININE 0.57 11/23/2019   CALCIUM 8.6 (L) 11/23/2019    Studies/Results: No results found.  Assessment/Plan: Seems to be making slow but steady progress neurologically.  More headache but more awake and appropriate today  Estimated body mass index is 20.85 kg/m as calculated from the following:   Height as of this encounter: 5\' 10"  (1.778 m).   Weight as of this encounter: 65.9 kg.    LOS: 7 days    11/23/2019, 9:51 AM
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
## 2019-11-24 LAB — BASIC METABOLIC PANEL
Anion gap: 10 (ref 5–15)
BUN: 7 mg/dL (ref 6–20)
CO2: 23 mmol/L (ref 22–32)
Calcium: 8.8 mg/dL — ABNORMAL LOW (ref 8.9–10.3)
Chloride: 99 mmol/L (ref 98–111)
Creatinine, Ser: 0.55 mg/dL (ref 0.44–1.00)
GFR calc Af Amer: >60 mL/min (ref >60)
GFR calc non Af Amer: >60 mL/min (ref >60)
Glucose, Bld: 96 mg/dL (ref 70–99)
Potassium: 3.8 mmol/L (ref 3.5–5.1)
Sodium: 132 mmol/L — ABNORMAL LOW (ref 135–145)

## 2019-11-24 MED ORDER — MORPHINE SULFATE (PF) 2 MG/ML IV SOLN: 2.0000 mg | INTRAVENOUS | Status: DC | PRN

## 2019-11-24 MED ORDER — OXYCODONE HCL 5 MG PO TABS: 5.0000 mg | ORAL_TABLET | ORAL | Status: DC | PRN

## 2019-11-24 MED ORDER — ENSURE ENLIVE PO LIQD
237.0000 mL | Freq: Four times a day (QID) | ORAL | Status: DC
  Administered 2019-11-24 – 2019-11-27 (×10): 237 mL via ORAL

## 2019-11-24 MED ORDER — OXYCODONE HCL 5 MG PO TABS
5.0000 mg | ORAL_TABLET | Freq: Four times a day (QID) | ORAL | Status: DC | PRN
  Administered 2019-11-24: 5 mg via ORAL
  Filled 2019-11-24: qty 1

## 2019-11-24 MED ORDER — DOCUSATE SODIUM 100 MG PO CAPS: 100.0000 mg | ORAL_CAPSULE | Freq: Two times a day (BID) | ORAL | Status: DC | PRN
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
## 2019-11-25 ENCOUNTER — Inpatient Hospital Stay (HOSPITAL_COMMUNITY): Payer: BC Managed Care – PPO | Admitting: Anesthesiology

## 2019-11-25 ENCOUNTER — Encounter (HOSPITAL_COMMUNITY): Admission: EM | Disposition: A | Discharge status: Rehabilitation Facility | Payer: Self-pay | Source: Home / Self Care

## 2019-11-25 DIAGNOSIS — S022XXA Fracture of nasal bones, initial encounter for closed fracture: Secondary | ICD-10-CM

## 2019-11-25 DIAGNOSIS — S02412A LeFort II fracture, initial encounter for closed fracture: Secondary | ICD-10-CM

## 2019-11-25 DIAGNOSIS — S0281XA Fracture of other specified skull and facial bones, right side, initial encounter for closed fracture: Secondary | ICD-10-CM

## 2019-11-25 HISTORY — PX: CLOSED REDUCTION NASAL FRACTURE: SHX5365

## 2019-11-25 HISTORY — PX: ORIF MANDIBULAR FRACTURE: SHX2127

## 2019-11-25 LAB — CBC
HCT: 32.1 % — ABNORMAL LOW (ref 36.0–46.0)
Hemoglobin: 11.3 g/dL — ABNORMAL LOW (ref 12.0–15.0)
MCH: 29.4 pg (ref 26.0–34.0)
MCHC: 35.2 g/dL (ref 30.0–36.0)
MCV: 83.6 fL (ref 80.0–100.0)
Platelets: 262 10*3/uL (ref 150–400)
RBC: 3.84 MIL/uL — ABNORMAL LOW (ref 3.87–5.11)
RDW: 12.5 % (ref 11.5–15.5)
WBC: 6 10*3/uL (ref 4.0–10.5)
nRBC: 0 % (ref 0.0–0.2)

## 2019-11-25 LAB — BASIC METABOLIC PANEL
Anion gap: 12 (ref 5–15)
BUN: 8 mg/dL (ref 6–20)
CO2: 24 mmol/L (ref 22–32)
Calcium: 9.1 mg/dL (ref 8.9–10.3)
Chloride: 99 mmol/L (ref 98–111)
Creatinine, Ser: 0.49 mg/dL (ref 0.44–1.00)
GFR calc Af Amer: >60 mL/min (ref >60)
GFR calc non Af Amer: >60 mL/min (ref >60)
Glucose, Bld: 103 mg/dL — ABNORMAL HIGH (ref 70–99)
Potassium: 4.2 mmol/L (ref 3.5–5.1)
Sodium: 135 mmol/L (ref 135–145)

## 2019-11-25 LAB — MAGNESIUM: Magnesium: 2.4 mg/dL (ref 1.7–2.4)

## 2019-11-25 SURGERY — OPEN REDUCTION INTERNAL FIXATION (ORIF) MANDIBULAR FRACTURE
Anesthesia: General | Site: Nose

## 2019-11-25 MED ORDER — FENTANYL CITRATE (PF) 100 MCG/2ML IJ SOLN
INTRAMUSCULAR | Status: DC | PRN
  Administered 2019-11-25: 100 ug via INTRAVENOUS
  Administered 2019-11-25 (×3): 50 ug via INTRAVENOUS

## 2019-11-25 MED ORDER — METOCLOPRAMIDE HCL 5 MG/ML IJ SOLN: 10.0000 mg | Freq: Once | INTRAMUSCULAR | Status: DC | PRN

## 2019-11-25 MED ORDER — FENTANYL CITRATE (PF) 250 MCG/5ML IJ SOLN
INTRAMUSCULAR | Status: AC
  Filled 2019-11-25: qty 5

## 2019-11-25 MED ORDER — MORPHINE SULFATE (PF) 2 MG/ML IV SOLN
1.0000 mg | Freq: Three times a day (TID) | INTRAVENOUS | Status: DC | PRN
  Administered 2019-11-27: 1 mg via INTRAVENOUS
  Filled 2019-11-25: qty 1

## 2019-11-25 MED ORDER — MEPERIDINE HCL 25 MG/ML IJ SOLN: 6.2500 mg | INTRAMUSCULAR | Status: DC | PRN

## 2019-11-25 MED ORDER — HALOPERIDOL LACTATE 5 MG/ML IJ SOLN
INTRAMUSCULAR | Status: AC
  Filled 2019-11-25: qty 1

## 2019-11-25 MED ORDER — OXYMETAZOLINE HCL 0.05 % NA SOLN
NASAL | Status: AC
  Administered 2019-11-25: 2 via NASAL
  Filled 2019-11-25: qty 30

## 2019-11-25 MED ORDER — MUPIROCIN CALCIUM 2 % EX CREA
TOPICAL_CREAM | CUTANEOUS | Status: AC
  Filled 2019-11-25: qty 15

## 2019-11-25 MED ORDER — PROPOFOL 10 MG/ML IV BOLUS
INTRAVENOUS | Status: AC
  Filled 2019-11-25: qty 40

## 2019-11-25 MED ORDER — DEXAMETHASONE SODIUM PHOSPHATE 10 MG/ML IJ SOLN
INTRAMUSCULAR | Status: DC | PRN
  Administered 2019-11-25: 5 mg via INTRAVENOUS

## 2019-11-25 MED ORDER — SCOPOLAMINE 1 MG/3DAYS TD PT72
MEDICATED_PATCH | TRANSDERMAL | Status: DC | PRN
  Administered 2019-11-25: 1 via TRANSDERMAL

## 2019-11-25 MED ORDER — CEFAZOLIN SODIUM-DEXTROSE 2-4 GM/100ML-% IV SOLN
2.0000 g | INTRAVENOUS | Status: AC
  Administered 2019-11-25: 2 g via INTRAVENOUS

## 2019-11-25 MED ORDER — OXYMETAZOLINE HCL 0.05 % NA SOLN
NASAL | Status: DC | PRN
  Administered 2019-11-25: 1

## 2019-11-25 MED ORDER — HYDROMORPHONE HCL 1 MG/ML IJ SOLN
0.2500 mg | INTRAMUSCULAR | Status: DC | PRN
  Administered 2019-11-25 (×2): 0.5 mg via INTRAVENOUS

## 2019-11-25 MED ORDER — DOCUSATE SODIUM 50 MG/5ML PO LIQD: 100.0000 mg | Freq: Two times a day (BID) | ORAL | Status: DC | PRN

## 2019-11-25 MED ORDER — ROCURONIUM BROMIDE 50 MG/5ML IV SOSY
PREFILLED_SYRINGE | INTRAVENOUS | Status: DC | PRN
  Administered 2019-11-25: 60 mg via INTRAVENOUS

## 2019-11-25 MED ORDER — ENOXAPARIN SODIUM 30 MG/0.3ML ~~LOC~~ SOLN
30.0000 mg | Freq: Two times a day (BID) | SUBCUTANEOUS | Status: DC
  Administered 2019-11-25 – 2019-11-27 (×4): 30 mg via SUBCUTANEOUS
  Filled 2019-11-25 (×4): qty 0.3

## 2019-11-25 MED ORDER — CEFAZOLIN SODIUM-DEXTROSE 2-4 GM/100ML-% IV SOLN
INTRAVENOUS | Status: AC
  Filled 2019-11-25: qty 100

## 2019-11-25 MED ORDER — HALOPERIDOL LACTATE 5 MG/ML IJ SOLN
5.0000 mg | Freq: Once | INTRAMUSCULAR | Status: AC
  Administered 2019-11-25: 5 mg via INTRAVENOUS

## 2019-11-25 MED ORDER — ONDANSETRON HCL 4 MG/2ML IJ SOLN
INTRAMUSCULAR | Status: DC | PRN
  Administered 2019-11-25: 4 mg via INTRAVENOUS

## 2019-11-25 MED ORDER — LIDOCAINE-EPINEPHRINE 1 %-1:100000 IJ SOLN
INTRAMUSCULAR | Status: AC
  Filled 2019-11-25: qty 1

## 2019-11-25 MED ORDER — ASPIRIN 81 MG PO CHEW
81.0000 mg | CHEWABLE_TABLET | Freq: Every day | ORAL | Status: DC
  Administered 2019-11-26 – 2019-11-27 (×2): 81 mg via ORAL
  Filled 2019-11-25 (×2): qty 1

## 2019-11-25 MED ORDER — HYDROMORPHONE HCL 1 MG/ML IJ SOLN
INTRAMUSCULAR | Status: AC
  Administered 2019-11-25: 0.5 mg via INTRAVENOUS
  Filled 2019-11-25: qty 1

## 2019-11-25 MED ORDER — SUGAMMADEX SODIUM 200 MG/2ML IV SOLN
INTRAVENOUS | Status: DC | PRN
  Administered 2019-11-25: 150 mg via INTRAVENOUS

## 2019-11-25 MED ORDER — MUPIROCIN CALCIUM 2 % EX CREA
TOPICAL_CREAM | CUTANEOUS | Status: DC | PRN
  Administered 2019-11-25: 1 via TOPICAL

## 2019-11-25 MED ORDER — LEVETIRACETAM 500 MG PO TABS
500.0000 mg | ORAL_TABLET | Freq: Every day | ORAL | Status: DC
  Administered 2019-11-25 – 2019-11-26 (×2): 500 mg via ORAL
  Filled 2019-11-25 (×2): qty 1

## 2019-11-25 MED ORDER — OXYMETAZOLINE HCL 0.05 % NA SOLN
NASAL | Status: AC
  Filled 2019-11-25: qty 30

## 2019-11-25 MED ORDER — 0.9 % SODIUM CHLORIDE (POUR BTL) OPTIME
TOPICAL | Status: DC | PRN
  Administered 2019-11-25: 1000 mL

## 2019-11-25 MED ORDER — LIDOCAINE-EPINEPHRINE 1 %-1:100000 IJ SOLN
INTRAMUSCULAR | Status: DC | PRN
  Administered 2019-11-25: 20 mL

## 2019-11-25 MED ORDER — LEVETIRACETAM 500 MG PO TABS: 500.0000 mg | ORAL_TABLET | Freq: Every day | ORAL | Status: DC

## 2019-11-25 MED ORDER — CLONAZEPAM 0.125 MG PO TBDP
0.2500 mg | ORAL_TABLET | Freq: Every day | ORAL | Status: DC
  Administered 2019-11-25 – 2019-11-26 (×2): 0.25 mg via ORAL
  Filled 2019-11-25 (×2): qty 2

## 2019-11-25 MED ORDER — OXYCODONE HCL 5 MG PO TABS
2.5000 mg | ORAL_TABLET | ORAL | Status: DC | PRN
  Administered 2019-11-25: 2.5 mg via ORAL
  Filled 2019-11-25: qty 1

## 2019-11-25 MED ORDER — PROPOFOL 10 MG/ML IV BOLUS
INTRAVENOUS | Status: DC | PRN
  Administered 2019-11-25: 20 mg via INTRAVENOUS
  Administered 2019-11-25: 200 mg via INTRAVENOUS
  Administered 2019-11-25: 30 mg via INTRAVENOUS

## 2019-11-25 MED ORDER — ARTIFICIAL TEARS OPHTHALMIC OINT
TOPICAL_OINTMENT | OPHTHALMIC | Status: DC | PRN
  Administered 2019-11-25: 1 via OPHTHALMIC

## 2019-11-25 MED ORDER — DEXMEDETOMIDINE HCL 200 MCG/2ML IV SOLN
INTRAVENOUS | Status: DC | PRN
  Administered 2019-11-25: 8 ug via INTRAVENOUS
  Administered 2019-11-25: 12 ug via INTRAVENOUS

## 2019-11-25 MED ORDER — LIDOCAINE 2% (20 MG/ML) 5 ML SYRINGE
INTRAMUSCULAR | Status: DC | PRN
  Administered 2019-11-25: 80 mg via INTRAVENOUS

## 2019-11-25 MED ORDER — LACTATED RINGERS IV SOLN: INTRAVENOUS | Status: DC | PRN

## 2019-11-25 MED ORDER — LACTATED RINGERS IV SOLN: INTRAVENOUS | Status: DC

## 2019-11-25 MED ORDER — ACETAMINOPHEN 500 MG PO TABS
1000.0000 mg | ORAL_TABLET | Freq: Four times a day (QID) | ORAL | Status: DC
  Administered 2019-11-25 – 2019-11-26 (×2): 1000 mg via ORAL
  Filled 2019-11-25 (×2): qty 2

## 2019-11-25 MED ORDER — OXYMETAZOLINE HCL 0.05 % NA SOLN: 2.0000 | Freq: Once | NASAL | Status: AC

## 2019-11-25 SURGICAL SUPPLY — 54 items
10pk 24g prestretched wire ×1 IMPLANT
BLADE CLIPPER SURG (BLADE) IMPLANT
BLADE SURG 15 STRL LF DISP TIS (BLADE) ×2 IMPLANT
BLADE SURG 15 STRL SS (BLADE) ×1
CANISTER SUCT 3000ML PPV (MISCELLANEOUS) ×3 IMPLANT
CLEANER TIP ELECTROSURG 2X2 (MISCELLANEOUS) ×3 IMPLANT
COVER BACK TABLE 60X90IN (DRAPES) ×3 IMPLANT
COVER MAYO STAND STRL (DRAPES) ×3 IMPLANT
COVER SURGICAL LIGHT HANDLE (MISCELLANEOUS) ×3 IMPLANT
COVER WAND RF STERILE (DRAPES) ×2 IMPLANT
ELECT COATED BLADE 2.86 ST (ELECTRODE) ×3 IMPLANT
ELECT REM PT RETURN 9FT ADLT (ELECTROSURGICAL) ×3
ELECTRODE REM PT RTRN 9FT ADLT (ELECTROSURGICAL) ×2 IMPLANT
GAUZE 4X4 16PLY RFD (DISPOSABLE) ×3 IMPLANT
GAUZE SPONGE 2X2 8PLY STRL LF (GAUZE/BANDAGES/DRESSINGS) ×2 IMPLANT
GLOVE BIO SURGEON STRL SZ 6.5 (GLOVE) ×3 IMPLANT
GOWN STRL REUS W/ TWL LRG LVL3 (GOWN DISPOSABLE) ×4 IMPLANT
GOWN STRL REUS W/TWL LRG LVL3 (GOWN DISPOSABLE) ×2
KIT BASIN OR (CUSTOM PROCEDURE TRAY) ×3 IMPLANT
KIT SPLINT NASAL DENVER PET BE (GAUZE/BANDAGES/DRESSINGS) ×1 IMPLANT
KIT TURNOVER KIT B (KITS) ×3 IMPLANT
NDL HYPO 25GX1X1/2 BEV (NEEDLE) ×2 IMPLANT
NDL PRECISIONGLIDE 27X1.5 (NEEDLE) ×2 IMPLANT
NEEDLE HYPO 25GX1X1/2 BEV (NEEDLE) ×3 IMPLANT
NEEDLE PRECISIONGLIDE 27X1.5 (NEEDLE) ×3 IMPLANT
NS IRRIG 1000ML POUR BTL (IV SOLUTION) ×3 IMPLANT
PAD ARMBOARD 7.5X6 YLW CONV (MISCELLANEOUS) ×6 IMPLANT
PATTIES SURGICAL .5 X3 (DISPOSABLE) ×3 IMPLANT
PENCIL BUTTON HOLSTER BLD 10FT (ELECTRODE) ×3 IMPLANT
POSITIONER HEAD DONUT 9IN (MISCELLANEOUS) ×3 IMPLANT
PROTECTOR CORNEAL (OPHTHALMIC RELATED) IMPLANT
SCISSORS WIRE ANG 4 3/4 DISP (INSTRUMENTS) ×3 IMPLANT
SCREW SD IMF HEX 2.0X11 (Screw) ×1 IMPLANT
SCREW SD IMF HEX 2.0X9 (Screw) ×1 IMPLANT
SET WALTER ACTIVATION W/DRAPE (SET/KITS/TRAYS/PACK) ×1 IMPLANT
SPLINT NASAL DOYLE BI-VL (GAUZE/BANDAGES/DRESSINGS) ×1 IMPLANT
SPLINT NASAL THERMO PLAST (MISCELLANEOUS) ×3 IMPLANT
SPONGE GAUZE 2X2 STER 10/PKG (GAUZE/BANDAGES/DRESSINGS) ×1
SUT MON AB 3-0 SH 27 (SUTURE) ×2
SUT MON AB 3-0 SH27 (SUTURE) ×4 IMPLANT
SUT PROLENE 6 0 PC 1 (SUTURE) IMPLANT
SUT STEEL 0 (SUTURE)
SUT STEEL 0 18XMFL TIE 17 (SUTURE) IMPLANT
SUT STEEL 1 (SUTURE) IMPLANT
SUT STEEL 2 (SUTURE) IMPLANT
SUT STEEL 4 (SUTURE) IMPLANT
SUT VICRYL 4-0 PS2 18IN ABS (SUTURE) IMPLANT
SYR CONTROL 10ML LL (SYRINGE) ×3 IMPLANT
TOWEL GREEN STERILE (TOWEL DISPOSABLE) ×3 IMPLANT
TOWEL GREEN STERILE FF (TOWEL DISPOSABLE) ×3 IMPLANT
TRAY ENT MC OR (CUSTOM PROCEDURE TRAY) ×3 IMPLANT
TUBE CONNECTING 12X1/4 (SUCTIONS) ×3 IMPLANT
WATER STERILE IRR 1000ML POUR (IV SOLUTION) ×3 IMPLANT
WIRE 24 GAUGE OMINIMAX MMF (WIRE) ×1 IMPLANT

## 2019-11-25 NOTE — Progress Notes (Signed)
Per Irving Burton in pharmacy, okay to crush all meds except ASA and Colace.  Pharmacy will change orders for Colace to liquid and ASA to chewable.

## 2019-11-25 NOTE — Anesthesia Procedure Notes (Signed)
Procedure Name: Intubation Date/Time: 11/25/2019 12:53 PM Performed by: Fransisca Kaufmann, CRNA Pre-anesthesia Checklist: Patient identified, Emergency Drugs available, Suction available and Patient being monitored Patient Re-evaluated:Patient Re-evaluated prior to induction Oxygen Delivery Method: Circle System Utilized Preoxygenation: Pre-oxygenation with 100% oxygen Induction Type: IV induction Ventilation: Mask ventilation without difficulty Grade View: Grade I Nasal Tubes: Nasal prep performed and Nasal Rae Tube size: 6.5 mm Number of attempts: 1 Airway Equipment and Method: Stylet and Oral airway Placement Confirmation: ETT inserted through vocal cords under direct vision,  positive ETCO2 and breath sounds checked- equal and bilateral Secured at: 25 cm Tube secured with: Tape Dental Injury: Teeth and Oropharynx as per pre-operative assessment
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
## 2019-11-25 NOTE — Transfer of Care (Signed)
Immediate Anesthesia Transfer of Care Note  Patient: Maria Daniels  Procedure(s) Performed: ORIF facial fractures (N/A Face) CLOSED REDUCTION NASAL FRACTURE (N/A Nose)  Patient Location: PACU  Anesthesia Type:General  Level of Consciousness: confused  Airway & Oxygen Therapy: Patient Spontanous Breathing  Post-op Assessment: Report given to RN and Post -op Vital signs reviewed and stable  Post vital signs: Reviewed and stable  Last Vitals:  Vitals Value Taken Time  BP 125/66 11/25/19 1442  Temp    Pulse 91 11/25/19 1446  Resp 13 11/25/19 1446  SpO2 97 % 11/25/19 1446  Vitals shown include unvalidated device data.  Last Pain:  Vitals:   11/25/19 1045  TempSrc:   PainSc: 8       Patients Stated Pain Goal: 0 (11/24/19 2035)  Complications: No apparent anesthesia complications
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
## 2019-11-25 NOTE — Anesthesia Postprocedure Evaluation (Signed)
Anesthesia Post Note  Patient: Maria Daniels  Procedure(s) Performed: ORIF facial fractures (N/A Face) CLOSED REDUCTION NASAL FRACTURE (N/A Nose)     Patient location during evaluation: PACU Anesthesia Type: General Level of consciousness: sedated, patient cooperative and oriented Pain management: pain level controlled (pain improving) Vital Signs Assessment: post-procedure vital signs reviewed and stable Respiratory status: spontaneous breathing, nonlabored ventilation and respiratory function stable Cardiovascular status: blood pressure returned to baseline and stable Postop Assessment: no apparent nausea or vomiting Anesthetic complications: no    Last Vitals:  Vitals:   11/25/19 1530 11/25/19 1540  BP: 129/85 130/77  Pulse: 65   Resp: 11   Temp:  36.6 C  SpO2: 97%     Last Pain:  Vitals:   11/25/19 1530  TempSrc:   PainSc: 8                  Debara Kamphuis,E. Jayona Mccaig

## 2019-11-25 NOTE — Progress Notes (Signed)
Inpatient Rehab Admissions Coordinator:   Discussed pt in trauma rounds.  Opened insurance today for possible admission tomorrow pending approval, bed availability, and medical readiness.   Estill Dooms, PT, DPT Admissions Coordinator [phone] 11/25/19  3:52 PM

## 2019-11-25 NOTE — Progress Notes (Signed)
Patient confused and impulsive, pulling all lines off.  Tele notified patient.  SCDs not on due to same reason as above.

## 2019-11-25 NOTE — Progress Notes (Signed)
OT Cancellation Note  Patient Details Name: Maria Daniels MRN: [id number] DOB: 1991/08/26   Cancelled Treatment:    Reason Eval/Treat Not Completed: Patient at procedure or test/ unavailable.  Pt in OR for facial fracture repair.  Will reattempt.  Eber Jones., OTR/L Acute Rehabilitation Services Pager [phone] Office [phone]   Jeani Hawking M 11/25/2019, 12:21 PM
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
## 2019-11-26 ENCOUNTER — Encounter: Payer: Self-pay | Admitting: *Deleted

## 2019-11-26 LAB — CBC
HCT: 35.2 % — ABNORMAL LOW (ref 36.0–46.0)
Hemoglobin: 12.1 g/dL (ref 12.0–15.0)
MCH: 29.7 pg (ref 26.0–34.0)
MCHC: 34.4 g/dL (ref 30.0–36.0)
MCV: 86.3 fL (ref 80.0–100.0)
Platelets: 289 10*3/uL (ref 150–400)
RBC: 4.08 MIL/uL (ref 3.87–5.11)
RDW: 12.8 % (ref 11.5–15.5)
WBC: 6.8 10*3/uL (ref 4.0–10.5)
nRBC: 0 % (ref 0.0–0.2)

## 2019-11-26 LAB — BASIC METABOLIC PANEL
Anion gap: 13 (ref 5–15)
BUN: 6 mg/dL (ref 6–20)
CO2: 27 mmol/L (ref 22–32)
Calcium: 9.5 mg/dL (ref 8.9–10.3)
Chloride: 102 mmol/L (ref 98–111)
Creatinine, Ser: 0.64 mg/dL (ref 0.44–1.00)
GFR calc Af Amer: >60 mL/min (ref >60)
GFR calc non Af Amer: >60 mL/min (ref >60)
Glucose, Bld: 102 mg/dL — ABNORMAL HIGH (ref 70–99)
Potassium: 3.7 mmol/L (ref 3.5–5.1)
Sodium: 142 mmol/L (ref 135–145)

## 2019-11-26 LAB — MAGNESIUM: Magnesium: 2.3 mg/dL (ref 1.7–2.4)

## 2019-11-26 LAB — PHOSPHORUS: Phosphorus: 5.1 mg/dL — ABNORMAL HIGH (ref 2.5–4.6)

## 2019-11-26 MED ORDER — ACETAMINOPHEN 160 MG/5ML PO SOLN
1000.0000 mg | Freq: Four times a day (QID) | ORAL | Status: DC
  Administered 2019-11-26 – 2019-11-27 (×5): 1000 mg via ORAL
  Filled 2019-11-26 (×6): qty 40.6

## 2019-11-26 MED ORDER — OXYCODONE HCL 5 MG/5ML PO SOLN
2.5000 mg | ORAL | Status: DC | PRN
  Administered 2019-11-26 – 2019-11-27 (×5): 2.5 mg via ORAL
  Filled 2019-11-26 (×7): qty 5
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
## 2019-11-26 NOTE — Progress Notes (Signed)
Mother in room for patient safety. All questions answered at this time.

## 2019-11-26 NOTE — Progress Notes (Addendum)
Inpatient Rehab Admissions Coordinator:   Opened insurance this AM.  Awaiting determination on authorization.  Left voicemail for mother, Almira Coaster, to update.    Addendum: no update from insurance.  Will f/u tomorrow.   Estill Dooms, PT, DPT Admissions Coordinator [phone] 11/26/19  12:47 PM
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
## 2019-11-26 NOTE — Progress Notes (Signed)
Rounded on pt and noticed she removed her [place] splint that was on her nose and left it on her tray table that was picked up already. It was removed after pt asked RN when can this come off, RN reassured her that it will be on for a few weeks and educated her on the needs for splint, she acknowledged understanding, and removed it once RN left the room. Will let plastics aware of this.
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
## 2019-11-27 ENCOUNTER — Encounter (HOSPITAL_COMMUNITY): Payer: Self-pay

## 2019-11-27 ENCOUNTER — Other Ambulatory Visit: Payer: Self-pay

## 2019-11-27 ENCOUNTER — Inpatient Hospital Stay (HOSPITAL_COMMUNITY)
Admission: RE | Admit: 2019-11-27 | Discharge: 2019-12-04 | DRG: 946 | Disposition: A | Discharge status: Home / Self Care | Payer: BC Managed Care – PPO | Source: Intra-hospital | Attending: Physical Medicine & Rehabilitation | Admitting: Physical Medicine & Rehabilitation

## 2019-11-27 DIAGNOSIS — F419 Anxiety disorder, unspecified: Secondary | ICD-10-CM | POA: Diagnosis present

## 2019-11-27 DIAGNOSIS — R4689 Other symptoms and signs involving appearance and behavior: Secondary | ICD-10-CM | POA: Diagnosis not present

## 2019-11-27 DIAGNOSIS — S0292XD Unspecified fracture of facial bones, subsequent encounter for fracture with routine healing: Secondary | ICD-10-CM | POA: Diagnosis not present

## 2019-11-27 DIAGNOSIS — Z8249 Family history of ischemic heart disease and other diseases of the circulatory system: Secondary | ICD-10-CM

## 2019-11-27 DIAGNOSIS — T402X5A Adverse effect of other opioids, initial encounter: Secondary | ICD-10-CM | POA: Diagnosis not present

## 2019-11-27 DIAGNOSIS — R451 Restlessness and agitation: Secondary | ICD-10-CM

## 2019-11-27 DIAGNOSIS — R4587 Impulsiveness: Secondary | ICD-10-CM | POA: Diagnosis present

## 2019-11-27 DIAGNOSIS — Z793 Long term (current) use of hormonal contraceptives: Secondary | ICD-10-CM | POA: Diagnosis not present

## 2019-11-27 DIAGNOSIS — S0292XS Unspecified fracture of facial bones, sequela: Secondary | ICD-10-CM | POA: Diagnosis not present

## 2019-11-27 DIAGNOSIS — S0181XD Laceration without foreign body of other part of head, subsequent encounter: Secondary | ICD-10-CM | POA: Diagnosis not present

## 2019-11-27 DIAGNOSIS — R260 Ataxic gait: Secondary | ICD-10-CM | POA: Diagnosis present

## 2019-11-27 DIAGNOSIS — Y92239 Unspecified place in hospital as the place of occurrence of the external cause: Secondary | ICD-10-CM | POA: Diagnosis not present

## 2019-11-27 DIAGNOSIS — R220 Localized swelling, mass and lump, head: Secondary | ICD-10-CM | POA: Diagnosis not present

## 2019-11-27 DIAGNOSIS — Z2989 Encounter for other specified prophylactic measures: Secondary | ICD-10-CM

## 2019-11-27 DIAGNOSIS — S06819D Injury of right internal carotid artery, intracranial portion, not elsewhere classified with loss of consciousness of unspecified duration, subsequent encounter: Secondary | ICD-10-CM

## 2019-11-27 DIAGNOSIS — S022XXD Fracture of nasal bones, subsequent encounter for fracture with routine healing: Secondary | ICD-10-CM | POA: Diagnosis not present

## 2019-11-27 DIAGNOSIS — S02609D Fracture of mandible, unspecified, subsequent encounter for fracture with routine healing: Secondary | ICD-10-CM | POA: Diagnosis not present

## 2019-11-27 DIAGNOSIS — F09 Unspecified mental disorder due to known physiological condition: Secondary | ICD-10-CM | POA: Diagnosis present

## 2019-11-27 DIAGNOSIS — Z79899 Other long term (current) drug therapy: Secondary | ICD-10-CM | POA: Diagnosis not present

## 2019-11-27 DIAGNOSIS — S069X9A Unspecified intracranial injury with loss of consciousness of unspecified duration, initial encounter: Secondary | ICD-10-CM | POA: Diagnosis present

## 2019-11-27 DIAGNOSIS — S062X9D Diffuse traumatic brain injury with loss of consciousness of unspecified duration, subsequent encounter: Secondary | ICD-10-CM | POA: Diagnosis present

## 2019-11-27 DIAGNOSIS — S0219XD Other fracture of base of skull, subsequent encounter for fracture with routine healing: Secondary | ICD-10-CM | POA: Diagnosis not present

## 2019-11-27 DIAGNOSIS — Z298 Encounter for other specified prophylactic measures: Secondary | ICD-10-CM

## 2019-11-27 DIAGNOSIS — S062X9S Diffuse traumatic brain injury with loss of consciousness of unspecified duration, sequela: Secondary | ICD-10-CM | POA: Diagnosis not present

## 2019-11-27 DIAGNOSIS — K5903 Drug induced constipation: Secondary | ICD-10-CM | POA: Diagnosis not present

## 2019-11-27 DIAGNOSIS — S27322D Contusion of lung, bilateral, subsequent encounter: Secondary | ICD-10-CM

## 2019-11-27 DIAGNOSIS — S062X9A Diffuse traumatic brain injury with loss of consciousness of unspecified duration, initial encounter: Secondary | ICD-10-CM | POA: Diagnosis present

## 2019-11-27 DIAGNOSIS — S020XXD Fracture of vault of skull, subsequent encounter for fracture with routine healing: Secondary | ICD-10-CM

## 2019-11-27 DIAGNOSIS — G629 Polyneuropathy, unspecified: Secondary | ICD-10-CM | POA: Diagnosis present

## 2019-11-27 DIAGNOSIS — T07XXXA Unspecified multiple injuries, initial encounter: Secondary | ICD-10-CM | POA: Diagnosis present

## 2019-11-27 DIAGNOSIS — S02412D LeFort II fracture, subsequent encounter for fracture with routine healing: Secondary | ICD-10-CM | POA: Diagnosis not present

## 2019-11-27 DIAGNOSIS — S06829D Injury of left internal carotid artery, intracranial portion, not elsewhere classified with loss of consciousness of unspecified duration, subsequent encounter: Secondary | ICD-10-CM | POA: Diagnosis not present

## 2019-11-27 DIAGNOSIS — S069X0A Unspecified intracranial injury without loss of consciousness, initial encounter: Secondary | ICD-10-CM

## 2019-11-27 DIAGNOSIS — S069X0S Unspecified intracranial injury without loss of consciousness, sequela: Secondary | ICD-10-CM | POA: Diagnosis not present

## 2019-11-27 DIAGNOSIS — S069XAA Unspecified intracranial injury with loss of consciousness status unknown, initial encounter: Secondary | ICD-10-CM | POA: Diagnosis present

## 2019-11-27 MED ORDER — OXYCODONE HCL 5 MG/5ML PO SOLN
5.0000 mg | ORAL | Status: DC | PRN
  Administered 2019-11-27: 5 mg via ORAL
  Administered 2019-11-28 (×2): 7.5 mg via ORAL
  Administered 2019-11-29: 5 mg via ORAL
  Administered 2019-11-29 – 2019-11-30 (×6): 7.5 mg via ORAL
  Administered 2019-12-01: 5 mg via ORAL
  Administered 2019-12-01: 7.5 mg via ORAL
  Administered 2019-12-02 (×2): 5 mg via ORAL
  Filled 2019-11-27: qty 5
  Filled 2019-11-27 (×3): qty 10
  Filled 2019-11-27 (×2): qty 5
  Filled 2019-11-27: qty 10
  Filled 2019-11-27 (×2): qty 5
  Filled 2019-11-27: qty 10
  Filled 2019-11-27: qty 5
  Filled 2019-11-27 (×4): qty 10

## 2019-11-27 MED ORDER — LEVETIRACETAM 500 MG PO TABS
500.0000 mg | ORAL_TABLET | Freq: Every day | ORAL | Status: AC
  Administered 2019-11-27: 500 mg via ORAL
  Filled 2019-11-27: qty 1

## 2019-11-27 MED ORDER — ENSURE ENLIVE PO LIQD
237.0000 mL | Freq: Four times a day (QID) | ORAL | Status: DC
  Administered 2019-11-28 – 2019-12-04 (×22): 237 mL via ORAL

## 2019-11-27 MED ORDER — QUETIAPINE FUMARATE 50 MG PO TABS
50.0000 mg | ORAL_TABLET | Freq: Once | ORAL | Status: AC
  Administered 2019-11-27: 50 mg via ORAL
  Filled 2019-11-27: qty 1

## 2019-11-27 MED ORDER — PROCHLORPERAZINE EDISYLATE 10 MG/2ML IJ SOLN: 5.0000 mg | Freq: Four times a day (QID) | INTRAMUSCULAR | Status: DC | PRN

## 2019-11-27 MED ORDER — BOOST / RESOURCE BREEZE PO LIQD CUSTOM
1.0000 | Freq: Two times a day (BID) | ORAL | Status: DC
  Administered 2019-11-28 – 2019-12-04 (×12): 1 via ORAL

## 2019-11-27 MED ORDER — CHLORHEXIDINE GLUCONATE 0.12 % MT SOLN
15.0000 mL | Freq: Four times a day (QID) | OROMUCOSAL | Status: DC
  Administered 2019-11-28 – 2019-12-04 (×16): 15 mL via OROMUCOSAL
  Filled 2019-11-27 (×10): qty 15

## 2019-11-27 MED ORDER — ENOXAPARIN SODIUM 30 MG/0.3ML ~~LOC~~ SOLN
30.0000 mg | Freq: Two times a day (BID) | SUBCUTANEOUS | Status: DC
  Administered 2019-11-28 – 2019-12-04 (×11): 30 mg via SUBCUTANEOUS
  Filled 2019-11-27 (×12): qty 0.3

## 2019-11-27 MED ORDER — QUETIAPINE FUMARATE 50 MG PO TABS: 50.0000 mg | ORAL_TABLET | Freq: Two times a day (BID) | ORAL | Status: DC

## 2019-11-27 MED ORDER — FLEET ENEMA 7-19 GM/118ML RE ENEM: 1.0000 | ENEMA | Freq: Once | RECTAL | Status: DC | PRN

## 2019-11-27 MED ORDER — ONDANSETRON 4 MG PO TBDP
4.0000 mg | ORAL_TABLET | Freq: Four times a day (QID) | ORAL | Status: DC | PRN
  Filled 2019-11-27: qty 1

## 2019-11-27 MED ORDER — ALUM & MAG HYDROXIDE-SIMETH 200-200-20 MG/5ML PO SUSP: 30.0000 mL | ORAL | Status: DC | PRN

## 2019-11-27 MED ORDER — BISACODYL 10 MG RE SUPP: 10.0000 mg | Freq: Every day | RECTAL | Status: DC | PRN

## 2019-11-27 MED ORDER — DOCUSATE SODIUM 50 MG/5ML PO LIQD
100.0000 mg | Freq: Two times a day (BID) | ORAL | Status: DC | PRN
  Administered 2019-11-29 – 2019-12-01 (×3): 100 mg via ORAL
  Filled 2019-11-27 (×3): qty 10

## 2019-11-27 MED ORDER — QUETIAPINE FUMARATE 50 MG PO TABS
50.0000 mg | ORAL_TABLET | Freq: Two times a day (BID) | ORAL | Status: DC
  Administered 2019-11-28 – 2019-11-29 (×3): 50 mg via ORAL
  Filled 2019-11-27 (×3): qty 1

## 2019-11-27 MED ORDER — CLONAZEPAM 0.25 MG PO TBDP
0.2500 mg | ORAL_TABLET | Freq: Every day | ORAL | Status: DC
  Administered 2019-11-27 – 2019-12-01 (×5): 0.25 mg via ORAL
  Filled 2019-11-27 (×6): qty 1

## 2019-11-27 MED ORDER — ASPIRIN 81 MG PO CHEW
81.0000 mg | CHEWABLE_TABLET | Freq: Every day | ORAL | Status: DC
  Administered 2019-11-28 – 2019-12-04 (×6): 81 mg via ORAL
  Filled 2019-11-27 (×7): qty 1

## 2019-11-27 MED ORDER — PROCHLORPERAZINE 25 MG RE SUPP: 12.5000 mg | Freq: Four times a day (QID) | RECTAL | Status: DC | PRN

## 2019-11-27 MED ORDER — RESOURCE THICKENUP CLEAR PO POWD
ORAL | Status: DC | PRN
  Filled 2019-11-27: qty 125

## 2019-11-27 MED ORDER — BOOST / RESOURCE BREEZE PO LIQD CUSTOM
1.0000 | Freq: Two times a day (BID) | ORAL | Status: DC
  Administered 2019-11-27: 1 via ORAL

## 2019-11-27 MED ORDER — GABAPENTIN 250 MG/5ML PO SOLN
100.0000 mg | Freq: Two times a day (BID) | ORAL | Status: DC
  Administered 2019-11-27 – 2019-12-04 (×13): 100 mg via ORAL
  Filled 2019-11-27 (×18): qty 2

## 2019-11-27 MED ORDER — METHOCARBAMOL 500 MG PO TABS
1000.0000 mg | ORAL_TABLET | Freq: Three times a day (TID) | ORAL | Status: DC
  Administered 2019-11-27 – 2019-12-04 (×20): 1000 mg via ORAL
  Filled 2019-11-27 (×20): qty 2

## 2019-11-27 MED ORDER — ACETAMINOPHEN 325 MG PO TABS
325.0000 mg | ORAL_TABLET | ORAL | Status: DC | PRN
  Administered 2019-12-02: 650 mg via ORAL
  Filled 2019-11-27 (×2): qty 2

## 2019-11-27 MED ORDER — ONDANSETRON HCL 4 MG/2ML IJ SOLN: 4.0000 mg | Freq: Four times a day (QID) | INTRAMUSCULAR | Status: DC | PRN

## 2019-11-27 MED ORDER — PROCHLORPERAZINE MALEATE 5 MG PO TABS: 5.0000 mg | ORAL_TABLET | Freq: Four times a day (QID) | ORAL | Status: DC | PRN

## 2019-11-27 MED ORDER — TRAZODONE HCL 50 MG PO TABS
25.0000 mg | ORAL_TABLET | Freq: Every evening | ORAL | Status: DC | PRN
  Administered 2019-11-28: 25 mg via ORAL
  Administered 2019-11-30: 50 mg via ORAL
  Filled 2019-11-27 (×2): qty 1

## 2019-11-27 MED ORDER — GUAIFENESIN-DM 100-10 MG/5ML PO SYRP: 5.0000 mL | ORAL_SOLUTION | Freq: Four times a day (QID) | ORAL | Status: DC | PRN

## 2019-11-27 MED ORDER — DIPHENHYDRAMINE HCL 12.5 MG/5ML PO ELIX: 12.5000 mg | ORAL_SOLUTION | Freq: Four times a day (QID) | ORAL | Status: DC | PRN

## 2019-11-27 MED ORDER — CHLORHEXIDINE GLUCONATE 0.12 % MT SOLN: 15.0000 mL | Freq: Two times a day (BID) | OROMUCOSAL | Status: DC

## 2019-11-27 MED ORDER — AMPHETAMINE-DEXTROAMPHETAMINE 10 MG PO TABS: 30.0000 mg | ORAL_TABLET | Freq: Two times a day (BID) | ORAL | Status: DC | PRN

## 2019-11-27 MED ORDER — POLYETHYLENE GLYCOL 3350 17 G PO PACK
17.0000 g | PACK | Freq: Every day | ORAL | Status: DC | PRN
  Administered 2019-11-30 – 2019-12-01 (×2): 17 g via ORAL
  Filled 2019-11-27 (×2): qty 1

## 2019-11-27 MED ORDER — ACETAMINOPHEN 160 MG/5ML PO SOLN
1000.0000 mg | Freq: Four times a day (QID) | ORAL | Status: DC
  Administered 2019-11-27 – 2019-11-30 (×8): 1000 mg via ORAL
  Administered 2019-11-30: 500 mg via ORAL
  Administered 2019-11-30 – 2019-12-04 (×12): 1000 mg via ORAL
  Filled 2019-11-27 (×21): qty 40.6
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
## 2019-11-27 NOTE — Progress Notes (Signed)
Patient noted walking around in room with nurse tech present. She was searching her room & on top of the computer counter. Asked patient what she was looking for, she stated scissors. She asked this nurse if she could have a pair to cut the wires in her mouth. Patient is not easily redirected. She began wandering around & outside of her room with staff there trying to redirect & educate patient. Enclosure bed was put in room for safety reasons. Since family left, patient has not stayed in bed. She is in constant motion & still looking for something to cut the wires in her mouth. She has also grabbed her bag & stated that she needed to leave. Her nurse gave her pain & anxiety medication & she was placed in bed. It is now 2230 & patient has unzipped the bed twice. Portable equipment are here to assess the bed.
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
## 2019-11-27 NOTE — TOC Transition Note (Signed)
Transition of Care [facility]) - CM/SW Discharge Note   Patient Details  Name: Maria Daniels MRN: [id number] Date of Birth: May 06, 1991  Transition of Care [facility]) CM/SW Contact:  Glennon Mac, RN Phone Number: 11/27/2019, 3:50 PM   Clinical Narrative:   Pt medically stable for discharge, and insurance auth received for admission to [facility] IP Rehab today.      Final next level of care: IP Rehab Facility Barriers to Discharge: Barriers Resolved                         Discharge Plan and Services   Discharge Planning Services: CM Consult                                 Social Determinants of Health (SDOH) Interventions     Readmission Risk Interventions No flowsheet data found.  Quintella Baton, RN, BSN  Trauma/Neuro ICU Case Manager [phone]
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
## 2019-11-27 NOTE — Discharge Instructions (Signed)
Full Liquid Diet A full liquid diet refers to fluids and foods that are liquid or will become liquid at room temperature. This diet should only be used for a short period of time to help you recover from illness or surgery. Your health care provider or dietitian will help you determine when it is safe to eat regular foods. What are tips for following this plan?     Reading food labels  Check food labels of nutrition shakes for the amount of protein. Look for nutrition shakes that have at least 8-10 grams of protein in each serving.  Look for drinks, such as milks and juices, that are "fortified" or "enriched." This means that vitamins and minerals have been added. Shopping  Buy premade nutritional shakes to keep on hand.  To vary your choices, buy different flavors of milks and shakes. Meal planning  Choose flavors and foods that you enjoy.  To make sure you get enough energy from food (calories): ? Eat 3 full liquid meals each day. Have a liquid snack between each meal. ? Drink 6-8 ounces (177-237 ml) of a nutrition supplement shake with meals or as snacks. ? Add protein powder, powdered milk, milk, or yogurt to shakes to increase the amount of protein.  Drink at least one serving a day of citrus fruit juice or fruit juice that has vitamin C added. General guidelines  Before starting the full-liquid diet, check with your health care provider to know what foods you should avoid. These may include full-fat or high-fiber liquids.  You may have any liquid or food that becomes a liquid at room temperature. The food is considered a liquid if it can be poured off a spoon at room temperature.  Do not drink alcohol unless approved by your health care provider.  This diet gives you most of the nutrients that you need for energy, but you may not get enough of certain vitamins, minerals, and fiber. Make sure to talk to your health care provider or dietitian about: ? How many calories you need  to eat get day. ? How much fluid you should have each day. ? Taking a multivitamin or a nutritional supplement. What foods are allowed? The items listed may not be a complete list. Talk with your dietitian about what dietary choices are best for you. Grains Thin hot cereal, such as cream of wheat. Soft-cooked pasta or rice pured in soup. Vegetables Pulp-free tomato or vegetable juice. Vegetables pured in soup. Fruits Fruit juice without pulp. Strained fruit pures (seeds and skins removed). Meats and other protein foods Beef, chicken, and fish broths. Powdered protein supplements. Dairy Milk and milk-based beverages, including milk shakes and instant breakfast mixes. Smooth yogurt. Pured cottage cheese. Beverages Water. Coffee and tea (caffeinated or decaffeinated). Cocoa. Liquid nutritional supplements. Soft drinks. Nondairy milks, such as almond, coconut, rice, or soy milk. Fats and oils Melted margarine and butter. Cream. Canola, almond, avocado, corn, grapeseed, sunflower, and sesame oils. Gravy. Sweets and desserts Custard. Pudding. Flavored gelatin. Smooth ice cream (without nuts or candy pieces). Sherbet. Popsicles. Italian ice. Pudding pops. Seasoning and other foods Salt and pepper. Spices. Cocoa powder. Vinegar. Ketchup. Yellow mustard. Smooth sauces, such as Hollandaise, cheese sauce, or white sauce. Soy sauce. Cream soups. Strained soups. Syrup. Honey. Jelly (without fruit pieces). What foods are not allowed? The items listed may not be a complete list. Talk with your dietitian about what dietary choices are best for you. Grains Whole grains. Pasta. Rice. Cold cereal. Bread. Crackers.   Vegetables All whole fresh, frozen, or canned vegetables. Fruits All whole fresh, frozen, or canned fruits. Meats and other protein foods All cuts of meat, poultry, and fish. Eggs. Tofu and soy protein. Nuts and nut butters. Lunch meat. Sausage. Dairy Hard cheese. Yogurt with fruit  chunks. Fats and oils Coconut oil. Palm oil. Lard. Cold butter. Sweets and desserts Ice cream or other frozen desserts that have any solids in them or on top, such as nuts, chocolate chips, and pieces of cookies. Cakes. Cookies. Candy. Seasoning and other foods Stone-ground mustards. Soups with chunks or pieces. Summary  A full liquid diet refers to fluids and foods that are liquid or will become liquid at room temperature.  This diet should only be used for a short period of time to help you recover from illness or surgery. Ask your health care provider or dietitian when it is safe for you to eat regular foods.  To make sure you get enough calories and nutrients, eat 3 meals each day with snacks between. Drink premade nutrition supplement shakes or add protein powder to homemade shakes. Take a vitamin and mineral supplement as told by your health care provider. This information is not intended to replace advice given to you by your health care provider. Make sure you discuss any questions you have with your health care provider. Document Revised: 02/03/2018 Document Reviewed: 12/21/2016 Elsevier Patient Education  2020 [employer]  Living With Traumatic Brain Injury Traumatic brain injury (TBI) is an injury to the brain that may be mild, moderate, or severe. Symptoms of any type of TBI can be long lasting (chronic). Depending on the area of the brain that is affected, a TBI can interfere with vision, memory, concentration, speech, balance, sense of touch, and sleep. TBI can also cause chronic symptoms like headache or dizziness. How to cope with lifestyle changes After a TBI, you may need to make changes to your lifestyle in order to recover as well as possible. How quickly and how fully you recover will depend on the severity of your injury. Your recovery plan may involve:  Working with specialists to develop a rehabilitation plan to help you return to your regular activities. Your health  care team may include: ? Physical or occupational therapists. ? Speech and language pathologists. ? Mental health counselors. ? Physicians like your primary care physician or neurologist.  Taking time off work or school, depending on your injury.  Avoiding situations where there is a risk for another head injury, such as football, hockey, soccer, basketball, martial arts, downhill snow sports, and horseback riding. Do not do these activities until your health care provider approves.  Resting. Rest helps the brain to heal. Make sure you: ? Get plenty of sleep at night. Avoid staying up late at night. ? Keep the same bedtime hours on weekends and weekdays. ? Rest during the day. Take daytime naps or rest breaks when you feel tired.  Avoiding extra stress on your eyes. You may need to set time limits when working on the computer, watching TV, and reading.  Finding ways to manage stress. This may include: ? Avoiding activities that cause stress. ? Deep breathing, yoga, or meditation. ? Listening to music or spending time outdoors.  Making lists, setting reminders, or using a day planner to help your memory.  Allowing yourself plenty of time to complete everyday tasks, such as grocery shopping, paying bills, and doing laundry.  Avoiding driving. Your ability to drive safely may be affected by your  injury. ? Rely on family, friends, or a transportation service to help you get around and to appointments. ? Have a professional evaluation to check your driving ability. ? Access support services to help you return to driving. These may include training and adaptive equipment. Follow these instructions at home:  Take over-the-counter and prescription medicines only as told by your health care provider. Do not take aspirin or other anti-inflammatory medicines such as ibuprofen or naproxen unless approved by your health care provider.  Avoid large amounts of caffeine. Your body may be more  sensitive to it after your injury.  Do not use any products that contain nicotine or tobacco, such as cigarettes, e-cigarettes, nicotine gum, and patches. If you need help quitting, ask your health care provider.  Do not use drugs.  Limit alcohol intake to no more than 1 drink per day for nonpregnant women and 2 drinks per day for men. One drink equals 12 ounces of beer, 5 ounces of wine, or 1 ounces of hard liquor.  Do not drive until cleared by your health care provider.  Keep all follow-up visits as told by your health care provider. This is important. Where to find support  Talk with your employer, co-workers, teachers, or school counselor about your injury. Work together to develop a plan for completing tasks while you recover.  Talk to others living with a TBI. Join a support group with other people who have experienced a TBI.  Let your friends and family members know what they can do to help. This might include helping at home or transportation to appointments.  If you are unable to continue working after your injury, talk to a Education officer, museum about options to help you meet your financial needs.  Seek out additional resources if you are a Teacher, adult education or family member, such as: ? Defense and [place]: dvbic.dcoe.mil ? [employer] [place]: [phone])106-9888 Questions to ask your health care provider:  How serious is my injury?  What is my rehabilitation plan?  What is my expected recovery?  When can I return to work or school?  When can I return to regular activities, including driving? Contact a health care provider if:  You have new or worsening: ? Dizziness. ? Headache. ? Anxiety or depression. ? Irritability. ? Confusion. ? Jerky movements that you cannot control (seizures). ? Extreme sensitivity to light or sound. ? Nausea or vomiting. Summary  Traumatic brain injury (TBI) is an injury to  your brain that can interfere with vision, memory, concentration, speech, balance, sense of touch, and sleep. TBI can also cause chronic symptoms like headache or dizziness.  After a TBI you may need to make several changes to your lifestyle in order to recover as well as possible. How quickly and how fully you recover will depend on the severity of your injury.  Talk to your family, friends, employer, co-workers, Tour manager, or school counselor about your injury. Work together to develop a plan for completing tasks while you recover. This information is not intended to replace advice given to you by your health care provider. Make sure you discuss any questions you have with your health care provider. Document Revised: 03/01/2019 Document Reviewed: 11/03/2016 Elsevier Patient Education  [employer].
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
## 2019-11-27 NOTE — Progress Notes (Signed)
Per Estill Dooms, admission coordinator, she had an in depth conversation about the use of an enclosure bed with the patients mother.  Mom was worried that the enclosure bed may agitate the patient more.  Luther Parody also discussed the option to have family stay with patient 24/7 for safety purposes.  At that time, the mother agreed to try the enclosure bed for a few days so she can go home and rest because she is having to deal with 3 of her children who are hospitalized.  Caitlin then spoke with Dr. Allena Katz who agreed to use the enclosure bed, with the patients safety being the priority.  Order placed and bed delivered.  Will change patient over to enclosure bed once visitors leave.  Also, NT noted that patient is up and walking in room with family present, no non-slip socks.  Bed alarm disengaged by family?  Stressed the importance of nursing staff being present for every transfer to ensure the safety of the patient.   Dani Gobble, RN

## 2019-11-27 NOTE — Plan of Care (Signed)
  Problem: Consults Goal: RH BRAIN INJURY PATIENT EDUCATION Description: Description: See Patient Education module for eduction specifics Outcome: Progressing Goal: Skin Care Protocol Initiated - if Braden Score 18 or less Description: If consults are not indicated, leave blank or document N/A Outcome: Progressing   Problem: RH SKIN INTEGRITY Goal: RH STG SKIN FREE OF INFECTION/BREAKDOWN Description: Patients skin will remain free from further infection or breakdown with min assist. Outcome: Progressing Goal: RH STG MAINTAIN SKIN INTEGRITY WITH ASSISTANCE Description: STG Maintain Skin Integrity With min Assistance. Outcome: Progressing   Problem: RH SAFETY Goal: RH STG ADHERE TO SAFETY PRECAUTIONS W/ASSISTANCE/DEVICE Description: STG Adhere to Safety Precautions With mod Assistance/Device. Outcome: Progressing Goal: RH STG DECREASED RISK OF FALL WITH ASSISTANCE Description: STG Decreased Risk of Fall With mod Assistance. Outcome: Progressing   Problem: RH COGNITION-NURSING Goal: RH STG USES MEMORY AIDS/STRATEGIES W/ASSIST TO PROBLEM SOLVE Description: STG Uses Memory Aids/Strategies With cues Assistance to Problem Solve. Outcome: Progressing Goal: RH STG ANTICIPATES NEEDS/CALLS FOR ASSIST W/ASSIST/CUES Description: STG Anticipates Needs/Calls for Assist With min Assistance/Cues. Outcome: Progressing   Problem: RH PAIN MANAGEMENT Goal: RH STG PAIN MANAGED AT OR BELOW PT'S PAIN GOAL Description: < 3 Outcome: Progressing   Problem: RH KNOWLEDGE DEFICIT BRAIN INJURY Goal: RH STG INCREASE KNOWLEDGE OF SELF CARE AFTER BRAIN INJURY Description: Patient/caregiver will verbalize understanding of management of TBI including stages of recovery, safety plan, cognitive impairments, home modifications, and follow up appointments with min assist. Outcome: Progressing   

## 2019-11-28 ENCOUNTER — Inpatient Hospital Stay (HOSPITAL_COMMUNITY): Payer: BC Managed Care – PPO | Admitting: Speech Pathology

## 2019-11-28 ENCOUNTER — Inpatient Hospital Stay (HOSPITAL_COMMUNITY): Payer: BC Managed Care – PPO

## 2019-11-28 ENCOUNTER — Inpatient Hospital Stay (HOSPITAL_COMMUNITY): Payer: BC Managed Care – PPO | Admitting: Physical Therapy

## 2019-11-28 DIAGNOSIS — R4689 Other symptoms and signs involving appearance and behavior: Secondary | ICD-10-CM

## 2019-11-28 DIAGNOSIS — S069X0S Unspecified intracranial injury without loss of consciousness, sequela: Secondary | ICD-10-CM

## 2019-11-28 DIAGNOSIS — S0292XS Unspecified fracture of facial bones, sequela: Secondary | ICD-10-CM

## 2019-11-28 DIAGNOSIS — S062X9S Diffuse traumatic brain injury with loss of consciousness of unspecified duration, sequela: Secondary | ICD-10-CM

## 2019-11-28 LAB — COMPREHENSIVE METABOLIC PANEL
ALT: 21 U/L (ref 0–44)
AST: 23 U/L (ref 15–41)
Albumin: 4 g/dL (ref 3.5–5.0)
Alkaline Phosphatase: 81 U/L (ref 38–126)
Anion gap: 14 (ref 5–15)
BUN: 9 mg/dL (ref 6–20)
CO2: 29 mmol/L (ref 22–32)
Calcium: 9.8 mg/dL (ref 8.9–10.3)
Chloride: 99 mmol/L (ref 98–111)
Creatinine, Ser: 0.63 mg/dL (ref 0.44–1.00)
GFR calc Af Amer: >60 mL/min (ref >60)
GFR calc non Af Amer: >60 mL/min (ref >60)
Glucose, Bld: 101 mg/dL — ABNORMAL HIGH (ref 70–99)
Potassium: 4 mmol/L (ref 3.5–5.1)
Sodium: 142 mmol/L (ref 135–145)
Total Bilirubin: 0.2 mg/dL — ABNORMAL LOW (ref 0.3–1.2)
Total Protein: 8.1 g/dL (ref 6.5–8.1)

## 2019-11-28 LAB — CBC WITH DIFFERENTIAL/PLATELET
Abs Immature Granulocytes: 0.01 10*3/uL (ref 0.00–0.07)
Basophils Absolute: 0 10*3/uL (ref 0.0–0.1)
Basophils Relative: 1 %
Eosinophils Absolute: 0.2 10*3/uL (ref 0.0–0.5)
Eosinophils Relative: 3 %
HCT: 38.9 % (ref 36.0–46.0)
Hemoglobin: 13 g/dL (ref 12.0–15.0)
Immature Granulocytes: 0 %
Lymphocytes Relative: 41 %
Lymphs Abs: 2.9 10*3/uL (ref 0.7–4.0)
MCH: 29.6 pg (ref 26.0–34.0)
MCHC: 33.4 g/dL (ref 30.0–36.0)
MCV: 88.6 fL (ref 80.0–100.0)
Monocytes Absolute: 0.5 10*3/uL (ref 0.1–1.0)
Monocytes Relative: 7 %
Neutro Abs: 3.3 10*3/uL (ref 1.7–7.7)
Neutrophils Relative %: 48 %
Platelets: 343 10*3/uL (ref 150–400)
RBC: 4.39 MIL/uL (ref 3.87–5.11)
RDW: 12.6 % (ref 11.5–15.5)
WBC: 7.1 10*3/uL (ref 4.0–10.5)
nRBC: 0 % (ref 0.0–0.2)

## 2019-11-28 LAB — MAGNESIUM: Magnesium: 2.3 mg/dL (ref 1.7–2.4)

## 2019-11-28 LAB — PHOSPHORUS: Phosphorus: 5.7 mg/dL — ABNORMAL HIGH (ref 2.5–4.6)
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
## 2019-11-28 NOTE — Progress Notes (Signed)
Patient slept all night, after she got into the enclosure bed at 2345. Woke up to use the restroom, states her jaw wiring is bothering her. Pain medicine offered and patient declined. Patient is currently in the enclosure bed with call bell at bedside.
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
## 2019-11-28 NOTE — Plan of Care (Signed)
Behavioral Plan   Rancho Level: V, emerging VI  Behavior to decrease/ eliminate:  -restlessness/wandering  -fall risk -agitation and pain related to wired jaw  -picking at incisions/abrasions   Changes to environment:  -unplug phone at night, but can keep in bed (but not plugged in)  -no cell phone   Interventions: -continue enclosure bed, use of hospital socks for safety -make set schedule for therapies  -provide w/ wax over wires of wired jaw for pain relief -encourage sleep/wake cycle   Recommendations for interactions with patient: -give patient choices - either return to enclosure bed or sitting at nurses station w/ direct supervision  -be direct, pt appears to take firm direction better  -can ambulate w/ contact guard w/ nursing staff around unit, avoid getting near locked security doors   Attendees:  Carlynn Purl, PT, DPT Feliberto Gottron, SLP Quita Skye, RN Royden Purl, Rec Therapist

## 2019-11-28 NOTE — Progress Notes (Signed)
Inpatient Rehabilitation  Patient information reviewed and entered into eRehab system by Jasalyn Frysinger M. Ameenah Prosser, M.A., CCC/SLP, PPS Coordinator.  Information including medical coding, functional ability and quality indicators will be reviewed and updated through discharge.    
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
## 2019-11-29 ENCOUNTER — Inpatient Hospital Stay (HOSPITAL_COMMUNITY): Payer: BC Managed Care – PPO

## 2019-11-29 ENCOUNTER — Inpatient Hospital Stay (HOSPITAL_COMMUNITY): Payer: BC Managed Care – PPO | Admitting: Speech Pathology

## 2019-11-29 ENCOUNTER — Inpatient Hospital Stay (HOSPITAL_COMMUNITY): Payer: BC Managed Care – PPO | Admitting: Physical Therapy

## 2019-11-29 LAB — MAGNESIUM: Magnesium: 2.4 mg/dL (ref 1.7–2.4)

## 2019-11-29 LAB — PHOSPHORUS: Phosphorus: 4.6 mg/dL (ref 2.5–4.6)

## 2019-11-29 SURGERY — CLOSED REDUCTION, FRACTURE, NASAL BONE
Anesthesia: General | Site: Nose

## 2019-11-29 MED ORDER — QUETIAPINE FUMARATE 50 MG PO TABS
50.0000 mg | ORAL_TABLET | Freq: Every day | ORAL | Status: DC
  Administered 2019-11-29 – 2019-12-03 (×5): 50 mg via ORAL
  Filled 2019-11-29 (×5): qty 1

## 2019-11-29 MED ORDER — OXYCODONE HCL 5 MG/5ML PO SOLN
2.5000 mg | Freq: Every day | ORAL | Status: DC
  Administered 2019-11-29 – 2019-12-03 (×5): 2.5 mg via ORAL
  Filled 2019-11-29 (×5): qty 5

## 2019-11-29 NOTE — Progress Notes (Signed)
Pt's mother called to be updated on how pt slept this past night. Mother is worried about pt picking at her wires and needing small peice of it to be cut off due to it irritating her lips.  Pt currently asleep, will continue to monitor.
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
## 2019-11-29 NOTE — Progress Notes (Signed)
Physical Therapy Session Note  Patient Details  Name: Maria Daniels MRN: [id number] Date of Birth: [birth year]  Today's Date: 11/29/2019 PT Individual Time: 9741-6384 PT Individual Time Calculation (min): 34 min   Short Term Goals: Week 1:  PT Short Term Goal 1 (Week 1): =LTGs due to ELOS  Skilled Therapeutic Interventions/Progress Updates:  Pt received in bed & agreeable to tx, but reporting she's tired. Pt does report need to use restroom & ambulates in room/bathroom without AD & supervision, performing toileting with continent void with supervision. Pt performs hand hygiene and returns to bed. Pt agreeable to playing card game & engages in match game with task focusing on working memory. Pt then declines OOB mobility reporting fatigue with therapist providing encouragement & pt stating "Oh don't feel bad, I could do it if I wanted to". Pt missed 41 minutes of skilled PT treatment. Pt left in enclosure bed with alarm in reach.  Pt c/o mouth discomfort & distraction provided.  Therapy Documentation Precautions:  Precautions Precautions: Fall Precaution Comments: enclosure bed 2/2 impulsivity, jaw wired shut Restrictions Weight Bearing Restrictions: No   General: PT Amount of Missed Time (min): 41 Minutes PT Missed Treatment Reason: Patient unwilling to participate   Therapy/Group: Individual Therapy  Sandi Mariscal 11/29/2019, 1:48 PM
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
## 2019-11-29 NOTE — Progress Notes (Signed)
Pt ambulated in hallway. Pt did one lap around the nurses station toward the dayroom, then the end of low hall and back to the pt's room. Pt then c/o left ear pain and stated wanting to get back in bed. Pt's father is at bedside.   Pt was calm throughout shift. Stayed in bed for most of the day, did not attempt to get out of bed. Pain med given as needed.  Pt is reusing clear liquid diet tray. Instead likes ensure and yoghurt. Provided during all meal times. Pt tolerating well. Continue plan of care.  Marylu Lund, RN

## 2019-11-29 NOTE — Progress Notes (Signed)
Speech Language Pathology Daily Session Note  Patient Details  Name: Maria Daniels MRN: [id number] Date of Birth: 1991-08-03  Today's Date: 11/29/2019 SLP Individual Time: 1100-1145 SLP Individual Time Calculation (min): 45 min  Short Term Goals: Week 1: SLP Short Term Goal 1 (Week 1): STGs=LTGs due to ELOS  Skilled Therapeutic Interventions: Skilled treatment session focused on cognitive goals. Upon arrival, patient was asleep but easily awakened. SLP facilitated session by providing Max-Total A multimodal cues for problem solving during a basic money management task and Mod A verbal cues for sustained attention.  Patient's nose started to spontaneously bleed and while walking back to her room to clean it, patient reported she felt like she was "going to pass out" and sat down in the hallway. With supervision verbal cues, patient returned to sitting and returned back to the enclosure bed. Patient consumed ~8 oz of boost via cup without overt s/s of aspiration. SLP initiated use of a memory notebook and patient independently recalled the date, place and situation as well as details of previous therapy session with mod A verbal cues. Patient missed remaining 15 mins of session due to continued reports of fatigue and not feeling well. Patient left in enclosure bed with all needs within reach. Continue with current plan of care.      Pain Pain Assessment Pain Scale: Faces Faces Pain Scale: Hurts even more Pain Type: Acute pain Pain Location: Ear Pain Orientation: Left Pain Descriptors / Indicators: Aching Pain Frequency: Intermittent Pain Onset: Unable to tell Patients Stated Pain Goal: 3 Pain Intervention(s): Medication (See eMAR)(oxycodone given)  Therapy/Group: Individual Therapy  Saahas Hidrogo 11/29/2019, 11:59 AM

## 2019-11-29 NOTE — Progress Notes (Signed)
Occupational Therapy Session Note  Patient Details  Name: Maria Daniels MRN: [id number] Date of Birth: 1991-03-21  Today's Date: 11/29/2019 OT Individual Time: 6979-4801 OT Individual Time Calculation (min): 55 min    Short Term Goals: Week 1:  OT Short Term Goal 1 (Week 1): STG=LTG d/t ELOS  Skilled Therapeutic Interventions/Progress Updates:    1;1. Pt received in bed agreeable to OT. Pt completes all mobilithy with CS for ambulating without AD. Pt given task to make trail making twister board following instructions to label each spot with a letter or a number. Pt able to follow instructions with no VC or mistakes. Attempted to have pt completes trail-making twister assessment, however unable to follow alternating number/letter pattern. Pt able to complete task with written list alternating letters and numbers in order. Pt requires 2 min 6 seconds to complete with list. Pt states she thinks she can complete without list after performing task 1x. Pt attempts and able to complete 1/2 of task before automatically switching with only searching for letters. MAX instructional cues to use lest for last 1/2 of assessment required. Pt takes 2 min 43 seconds to completes second trial. Pt plays corn hole and keeps score with tallied list of points with MAX A for VC for recalling scoring instructions and organization of tally sheet. Pt completes 4 min on Nustep on level 10 for sustained attention. Exited session with pt seated in vail bed, zipped up and call light in reach.   Therapy Documentation Precautions:  Precautions Precautions: Fall Precaution Comments: enclosure bed 2/2 impulsivity, jaw wired shut Restrictions Weight Bearing Restrictions: No General:   Vital Signs:   Pain: Pain Assessment Pain Scale: Faces Faces Pain Scale: Hurts little more Pain Type: Acute pain Pain Location: Jaw Pain Descriptors / Indicators: Discomfort Pain Onset: On-going Patients Stated Pain Goal: 3 Pain  Intervention(s): Medication (See eMAR)(gabapentin, robaxin) ADL:   Vision   Perception    Praxis   Exercises:   Other Treatments:     Therapy/Group: Individual Therapy  Shon Hale 11/29/2019, 9:51 AM

## 2019-11-30 ENCOUNTER — Inpatient Hospital Stay (HOSPITAL_COMMUNITY): Payer: BC Managed Care – PPO

## 2019-11-30 ENCOUNTER — Inpatient Hospital Stay (HOSPITAL_COMMUNITY): Admission: RE | Admit: 2019-11-30 | Payer: BC Managed Care – PPO | Source: Home / Self Care | Admitting: Plastic Surgery

## 2019-11-30 ENCOUNTER — Inpatient Hospital Stay (HOSPITAL_COMMUNITY): Payer: BC Managed Care – PPO | Admitting: Anesthesiology

## 2019-11-30 ENCOUNTER — Inpatient Hospital Stay (HOSPITAL_COMMUNITY): Payer: BC Managed Care – PPO | Admitting: Speech Pathology

## 2019-11-30 ENCOUNTER — Inpatient Hospital Stay (HOSPITAL_COMMUNITY): Payer: BC Managed Care – PPO | Admitting: Physical Therapy

## 2019-11-30 ENCOUNTER — Encounter (HOSPITAL_COMMUNITY): Payer: Self-pay | Admitting: Physical Medicine & Rehabilitation

## 2019-11-30 ENCOUNTER — Encounter (HOSPITAL_COMMUNITY)
Admission: RE | Disposition: A | Discharge status: Home / Self Care | Payer: Self-pay | Source: Intra-hospital | Attending: Physical Medicine & Rehabilitation

## 2019-11-30 DIAGNOSIS — S0292XD Unspecified fracture of facial bones, subsequent encounter for fracture with routine healing: Secondary | ICD-10-CM

## 2019-11-30 HISTORY — PX: MINOR REMOVAL OF MANDIBULAR HARDWARE: SHX6427

## 2019-11-30 LAB — MRSA PCR SCREENING: MRSA by PCR: NEGATIVE

## 2019-11-30 SURGERY — MINOR REMOVAL OF MANDIBULAR HARDWARE
Anesthesia: General | Site: Mouth

## 2019-11-30 MED ORDER — PROMETHAZINE HCL 25 MG/ML IJ SOLN: 6.2500 mg | INTRAMUSCULAR | Status: DC | PRN

## 2019-11-30 MED ORDER — KETAMINE HCL 50 MG/5ML IJ SOSY
PREFILLED_SYRINGE | INTRAMUSCULAR | Status: AC
  Filled 2019-11-30: qty 5

## 2019-11-30 MED ORDER — PROPOFOL 10 MG/ML IV BOLUS
INTRAVENOUS | Status: AC
  Filled 2019-11-30: qty 40

## 2019-11-30 MED ORDER — LACTATED RINGERS IV SOLN: INTRAVENOUS | Status: DC | PRN

## 2019-11-30 MED ORDER — GLYCOPYRROLATE 0.2 MG/ML IJ SOLN
INTRAMUSCULAR | Status: DC | PRN
  Administered 2019-11-30: .2 mg via INTRAVENOUS

## 2019-11-30 MED ORDER — 0.9 % SODIUM CHLORIDE (POUR BTL) OPTIME
TOPICAL | Status: DC | PRN
  Administered 2019-11-30: 1000 mL

## 2019-11-30 MED ORDER — LIDOCAINE-EPINEPHRINE 1 %-1:100000 IJ SOLN
INTRAMUSCULAR | Status: AC
  Filled 2019-11-30: qty 1

## 2019-11-30 MED ORDER — LIDOCAINE-EPINEPHRINE 1 %-1:100000 IJ SOLN
INTRAMUSCULAR | Status: DC | PRN
  Administered 2019-11-30: 2 mL

## 2019-11-30 MED ORDER — MIDAZOLAM HCL 2 MG/2ML IJ SOLN
INTRAMUSCULAR | Status: AC
  Filled 2019-11-30: qty 2

## 2019-11-30 MED ORDER — FENTANYL CITRATE (PF) 100 MCG/2ML IJ SOLN
25.0000 ug | INTRAMUSCULAR | Status: DC | PRN
  Administered 2019-11-30 (×2): 25 ug via INTRAVENOUS

## 2019-11-30 MED ORDER — FENTANYL CITRATE (PF) 100 MCG/2ML IJ SOLN
INTRAMUSCULAR | Status: AC
  Filled 2019-11-30: qty 2

## 2019-11-30 MED ORDER — PROPOFOL 10 MG/ML IV BOLUS
INTRAVENOUS | Status: DC | PRN
  Administered 2019-11-30: 50 mg via INTRAVENOUS
  Administered 2019-11-30: 30 mg via INTRAVENOUS
  Administered 2019-11-30 (×2): 50 mg via INTRAVENOUS
  Administered 2019-11-30: 30 mg via INTRAVENOUS
  Administered 2019-11-30 (×6): 50 mg via INTRAVENOUS

## 2019-11-30 MED ORDER — LIDOCAINE HCL (CARDIAC) PF 100 MG/5ML IV SOSY
PREFILLED_SYRINGE | INTRAVENOUS | Status: DC | PRN
  Administered 2019-11-30: 80 mg via INTRAVENOUS

## 2019-11-30 MED ORDER — FENTANYL CITRATE (PF) 100 MCG/2ML IJ SOLN: 100.0000 ug | Freq: Once | INTRAMUSCULAR | Status: DC

## 2019-11-30 MED ORDER — GLYCOPYRROLATE PF 0.2 MG/ML IJ SOSY
PREFILLED_SYRINGE | INTRAMUSCULAR | Status: AC
  Filled 2019-11-30: qty 1

## 2019-11-30 SURGICAL SUPPLY — 32 items
BLADE SURG 15 STRL LF DISP TIS (BLADE) ×1 IMPLANT
BLADE SURG 15 STRL SS (BLADE) ×1
CANISTER SUCT 3000ML PPV (MISCELLANEOUS) ×2 IMPLANT
COVER SURGICAL LIGHT HANDLE (MISCELLANEOUS) ×2 IMPLANT
COVER WAND RF STERILE (DRAPES) ×2 IMPLANT
DECANTER SPIKE VIAL GLASS SM (MISCELLANEOUS) ×2 IMPLANT
DRAPE BACK TABLE (DRAPES) ×2 IMPLANT
DRAPE HALF SHEET 40X57 (DRAPES) ×2 IMPLANT
ELECT COATED BLADE 2.86 ST (ELECTRODE) IMPLANT
ELECT REM PT RETURN 9FT ADLT (ELECTROSURGICAL)
ELECTRODE REM PT RTRN 9FT ADLT (ELECTROSURGICAL) IMPLANT
GAUZE 4X4 16PLY RFD (DISPOSABLE) ×2 IMPLANT
GLOVE BIO SURGEON STRL SZ 6.5 (GLOVE) ×2 IMPLANT
GOWN STRL REUS W/ TWL LRG LVL3 (GOWN DISPOSABLE) ×2 IMPLANT
GOWN STRL REUS W/TWL LRG LVL3 (GOWN DISPOSABLE) ×2
KIT BASIN OR (CUSTOM PROCEDURE TRAY) ×2 IMPLANT
KIT TURNOVER KIT B (KITS) ×2 IMPLANT
NEEDLE PRECISIONGLIDE 27X1.5 (NEEDLE) ×2 IMPLANT
NS IRRIG 1000ML POUR BTL (IV SOLUTION) ×2 IMPLANT
PAD ARMBOARD 7.5X6 YLW CONV (MISCELLANEOUS) ×4 IMPLANT
PENCIL BUTTON HOLSTER BLD 10FT (ELECTRODE) IMPLANT
SUCTION FRAZIER HANDLE 10FR (MISCELLANEOUS)
SUCTION TUBE FRAZIER 10FR DISP (MISCELLANEOUS) IMPLANT
SUT CHROMIC 3 0 PS 2 (SUTURE) ×2 IMPLANT
SUT CHROMIC 4 0 PS 2 18 (SUTURE) ×2 IMPLANT
SUT VICRYL 4-0 PS2 18IN ABS (SUTURE) ×2 IMPLANT
SYR CONTROL 10ML LL (SYRINGE) ×2 IMPLANT
TOWEL GREEN STERILE (TOWEL DISPOSABLE) ×2 IMPLANT
TOWEL GREEN STERILE FF (TOWEL DISPOSABLE) ×2 IMPLANT
TUBE CONNECTING 12X1/4 (SUCTIONS) ×2 IMPLANT
WATER STERILE IRR 1000ML POUR (IV SOLUTION) IMPLANT
YANKAUER SUCT BULB TIP NO VENT (SUCTIONS) ×2 IMPLANT
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
## 2019-11-30 NOTE — Plan of Care (Signed)
  Problem: Consults Goal: RH BRAIN INJURY PATIENT EDUCATION Description: Description: See Patient Education module for eduction specifics Outcome: Progressing Goal: Skin Care Protocol Initiated - if Braden Score 18 or less Description: If consults are not indicated, leave blank or document N/A Outcome: Progressing   Problem: RH SKIN INTEGRITY Goal: RH STG SKIN FREE OF INFECTION/BREAKDOWN Description: Patients skin will remain free from further infection or breakdown with min assist. Outcome: Progressing Goal: RH STG MAINTAIN SKIN INTEGRITY WITH ASSISTANCE Description: STG Maintain Skin Integrity With min Assistance. Outcome: Progressing   Problem: RH SAFETY Goal: RH STG ADHERE TO SAFETY PRECAUTIONS W/ASSISTANCE/DEVICE Description: STG Adhere to Safety Precautions With mod Assistance/Device. Outcome: Progressing Goal: RH STG DECREASED RISK OF FALL WITH ASSISTANCE Description: STG Decreased Risk of Fall With mod Assistance. Outcome: Progressing   Problem: RH COGNITION-NURSING Goal: RH STG USES MEMORY AIDS/STRATEGIES W/ASSIST TO PROBLEM SOLVE Description: STG Uses Memory Aids/Strategies With cues Assistance to Problem Solve. Outcome: Progressing Goal: RH STG ANTICIPATES NEEDS/CALLS FOR ASSIST W/ASSIST/CUES Description: STG Anticipates Needs/Calls for Assist With min Assistance/Cues. Outcome: Progressing   Problem: RH PAIN MANAGEMENT Goal: RH STG PAIN MANAGED AT OR BELOW PT'S PAIN GOAL Description: < 3 Outcome: Progressing   Problem: RH KNOWLEDGE DEFICIT BRAIN INJURY Goal: RH STG INCREASE KNOWLEDGE OF SELF CARE AFTER BRAIN INJURY Description: Patient/caregiver will verbalize understanding of management of TBI including stages of recovery, safety plan, cognitive impairments, home modifications, and follow up appointments with min assist. Outcome: Progressing   

## 2019-11-30 NOTE — Transfer of Care (Signed)
Immediate Anesthesia Transfer of Care Note  Patient: Maria Daniels  Procedure(s) Performed: REPLACEMENT [name] (N/A Mouth)  Patient Location: PACU  Anesthesia Type:General  Level of Consciousness: drowsy, patient cooperative and responds to stimulation  Airway & Oxygen Therapy: Patient Spontanous Breathing  Post-op Assessment: Report given to RN and Post -op Vital signs reviewed and stable  Post vital signs: Reviewed and stable  Last Vitals:  Vitals Value Taken Time  BP    Temp    Pulse    Resp    SpO2      Last Pain: There were no vitals filed for this visit.       Complications: No apparent anesthesia complications

## 2019-11-30 NOTE — Anesthesia Procedure Notes (Signed)
Procedure Name: General with mask airway Date/Time: 11/30/2019 7:45 AM Performed by: Chelsea Aus, CRNA Pre-anesthesia Checklist: Patient identified, Emergency Drugs available, Suction available and Patient being monitored Patient Re-evaluated:Patient Re-evaluated prior to induction Oxygen Delivery Method: Circle system utilized Preoxygenation: Pre-oxygenation with 100% oxygen Induction Type: IV induction Ventilation: Mask ventilation without difficulty Dental Injury: Teeth and Oropharynx as per pre-operative assessment

## 2019-11-30 NOTE — Interval H&P Note (Signed)
History and Physical Interval Note:  11/30/2019 7:17 AM  Maria Daniels  has presented today for surgery, with the diagnosis of mandible fracture.  The various methods of treatment have been discussed with the patient and family. After consideration of risks, benefits and other options for treatment, the patient has consented to  Procedure(s): REPLACEMENT MAXOMANDIBULAR WIRE (N/A) as a surgical intervention.  The patient's history has been reviewed, patient examined, no change in status, stable for surgery.  I have reviewed the patient's chart and labs.  Questions were answered to the patient's satisfaction.     Alena Bills [place]

## 2019-11-30 NOTE — Progress Notes (Signed)
Physical Therapy Session Note  Patient Details  Name: Maria Daniels MRN: [id number] Date of Birth: 14-Jun-1991  Today's Date: 11/30/2019 PT Individual Time: 3494-9447 PT Individual Time Calculation (min): 24 min   Short Term Goals: Week 1:  PT Short Term Goal 1 (Week 1): =LTGs due to ELOS  Skilled Therapeutic Interventions/Progress Updates:  Pt received in bed reporting significant unrated pain in mouth (pt recently returned from procedure regarding replacement of wire in mouth) & pain meds requested & from nurse. Provided pt with paper scrub pants & top & pt donned them with supervision for standing balance 2/2 impulsivity with task. Pt ambulates around unit without AD & supervision. Therapist provided pt with ice pack to hold on mouth for pain management. Therapist provided pt with options for distracting activities (playing the Wii, puzzle, checkers) & educated pt on need to distract herself from pain but pt declined all activities. Pt ambulated to ortho gym & back to room without AD & supervision. Pt declined practicing car transfer. Therapist completed entry in memory book & pt left in enclosure bed with call bell in reach.  Therapy Documentation Precautions:  Precautions Precautions: Fall Precaution Comments: enclosure bed 2/2 impulsivity, jaw wired shut Restrictions Weight Bearing Restrictions: No   General: PT Amount of Missed Time (min): 36 Minutes PT Missed Treatment Reason: Patient unwilling to participate;Pain     Therapy/Group: Individual Therapy  Sandi Mariscal 11/30/2019, 9:35 AM

## 2019-11-30 NOTE — Progress Notes (Signed)
Occupational Therapy Session Note  Patient Details  Name: Maria Daniels MRN: [id number] Date of Birth: 1991-03-09  Today's Date: 11/30/2019 OT Individual Time: 1300-1400 OT Individual Time Calculation (min): 60 min    Short Term Goals: Week 1:  OT Short Term Goal 1 (Week 1): STG=LTG d/t ELOS  Skilled Therapeutic Interventions/Progress Updates:    1:1. Pt follow the following schedule to hcallenge time management, self monitoring, ADL/IADL skills and pathfinding in multiple tx spaces:  Start  End Activity  1 PM 1:10PM Scavenger Hunt for items/rooms listed  1:10 PM 1:20PM Apply bed sheets to bed in apartment  1:20 PM 1:30PM Put pills in organizer  1:30 PM 1:40 PM Complete 1 pipe tree activity & 1 peg board picture  1:40PM 1:50 PM Bathe in apartment & put on paper scrubs  1:50PM 2PM Load dirty clothes into washer/dryer   Pt completes all activities with S for mobility and no AD. Decreased staggering and weaving while ambulating in hallways this date. Pt able to recall locations of various treatment spaces with min VC fading to no cueing by end of session. Pt able to orient sheets on bed, however requires VC for application of pillow cases onto pillows instead of placing on top. Pt requires MOD VC for attention to time management as well as problem solving pipe tree figure. Pt frustrated when unable to solve error with visual spatial task, but able to re-attempt after seated break. Pt bathes/dresses standing in tub to simulate home environment with VC for seated threading of LEs. Pt requires min VC for visual scanning to locate washer start button at top R of washing machine. Exited session with pt returned to bed, zipped enclosure and call light in reach.  Therapy Documentation Precautions:  Precautions Precautions: Fall Precaution Comments: enclosure bed 2/2 impulsivity, jaw wired shut Restrictions Weight Bearing Restrictions: No General:   Vital Signs:   Pain: Pain  Assessment Pain Scale: Faces Faces Pain Scale: Hurts little more Pain Type: Acute pain Pain Location: Jaw Pain Onset: On-going Pain Intervention(s): Cold applied ADL:   Vision   Perception    Praxis   Exercises:   Other Treatments:     Therapy/Group: Individual Therapy  Shon Hale 11/30/2019, 1:59 PM
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
## 2019-11-30 NOTE — Progress Notes (Signed)
Pt restless in enclosure bed. RN ambulated pt around nursing station. Once pt returned to room, pt denied need for toileting, fluids or PRN meds. Pt back in bed, calm and resting. Pt's family called was updated on how pt's day went.   Will continue to monitor.

## 2019-11-30 NOTE — Anesthesia Postprocedure Evaluation (Signed)
Anesthesia Post Note  Patient: Maria Daniels  Procedure(s) Performed: REPLACEMENT MAXOMANDIBULAR WIRE (N/A Mouth)     Patient location during evaluation: PACU Anesthesia Type: General Level of consciousness: sedated Pain management: pain level controlled Vital Signs Assessment: post-procedure vital signs reviewed and stable Respiratory status: spontaneous breathing and respiratory function stable Cardiovascular status: stable Postop Assessment: no apparent nausea or vomiting Anesthetic complications: no    Last Vitals:  Vitals:   11/30/19 0821 11/30/19 0836  BP: 122/87 124/83  Pulse: 95 76  Resp: 18 14  Temp: 37.1 C 37.1 C  SpO2: 100% 99%    Last Pain:  Vitals:   11/30/19 0821  TempSrc:   PainSc: 0-No pain                 Langdon Crosson DANIEL

## 2019-11-30 NOTE — Progress Notes (Addendum)
Speech Language Pathology Daily Session Note  Patient Details  Name: Maria Daniels MRN: [id number] Date of Birth: 1991-04-05  Today's Date: 11/30/2019 SLP Individual Time: 1045-1130 SLP Individual Time Calculation (min): 45 min  Short Term Goals: Week 1: SLP Short Term Goal 1 (Week 1): STGs=LTGs due to ELOS  Skilled Therapeutic Interventions: Patient received skilled SLP services targeting cognitive goals. Patient completed a money management task (counting money/ subtracting&adding money) with 75% accuracy and mod verbal cues. Patient participated in an attention task sorting a deck of cards by number and color with 100% accuracy and min verbal cues. Patient required max verbal cues to complete a sustained attention task identifing a word that begins with each letter of the alphabet. Patient's mother had questions regarding TBI and cognitive deficits. All questions were answered regarding correlation of TBI and cognitive deficits. Patient missed remaining 15 minutes of therapy due to fatigue and pain from procedure this morning. Patient with ice pack for her jaw. At the end of therapy session patient was in bed with mother present at bedside.  Pain Pain Assessment Pain Scale: Faces Faces Pain Scale: Hurts little more Pain Type: Acute pain Pain Location: Jaw Pain Onset: On-going Pain Intervention(s): Cold applied  Therapy/Group: Individual Therapy  Arnette Schaumann 11/30/2019, 11:51 AM
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
## 2019-12-01 ENCOUNTER — Inpatient Hospital Stay (HOSPITAL_COMMUNITY): Payer: BC Managed Care – PPO | Admitting: Speech Pathology

## 2019-12-01 MED ORDER — SENNOSIDES-DOCUSATE SODIUM 8.6-50 MG PO TABS
2.0000 | ORAL_TABLET | Freq: Every day | ORAL | Status: DC
  Administered 2019-12-03: 2 via ORAL
  Filled 2019-12-01: qty 2

## 2019-12-01 MED ORDER — SORBITOL 70 % SOLN: 30.0000 mL | Freq: Every day | Status: DC | PRN

## 2019-12-01 NOTE — Progress Notes (Signed)
Speech Language Pathology Daily Session Note  Patient Details  Name: Maria Daniels MRN: [id number] Date of Birth: [birth year]  Today's Date: 12/01/2019 SLP Individual Time: 1110-1210 SLP Individual Time Calculation (min): 60 min  Short Term Goals: Week 1: SLP Short Term Goal 1 (Week 1): STGs=LTGs due to ELOS  Skilled Therapeutic Interventions:  Pt was seen for skilled ST targeting cognitive goals.  Upon arrival, pt was sitting in opened enclosure bed with her mother and father at bedside.   Pt was pleasantly interactive and willing to participate in treatment session.  She ambulated to the treatment room with contact guard and was able to recall route back to her room at the end of today's treatment session with supervision.  SLP facilitated the session with a novel card game to address problem solving goals.  Pt planned and executed a problem solving strategy with min assist faded to supervision verbal cues.  Pt also completed the medication management task from the ALFA standardized cognitive assessment with min assist verbal cues to recognize and correct errors.  Additionally, pt completed a mildly complex deductive reasoning puzzle with mod assist verbal cues for task organization, attention to detail, and correction of errors.  Within task, pt seemed to have good insight into her errors and asked for help appropriately but needed assistance to generate a solution to the problem.  Upon return to room, pt's mother had multiple questions regarding pt's goals for therapy, current level of function, and prognosis for return to previous level of function.  SLP provided skilled education regarding sequelae of brain injury recovery and realistic prognostic expectations for recovery.  SLP also provided pt with a packet of worksheets that she may work on in between therapy sessions per family's request.  All questions were answered to their satisfaction at this time.  Pt was left in opened enclosure bed with  mother and father at bedside.  Continue per current plan of care.    Pain Pain Assessment Pain Scale: 0-10 Pain Score: 0-No pain  Therapy/Group: Individual Therapy  Clance Baquero, Melanee Spry 12/01/2019, 3:55 PM

## 2019-12-01 NOTE — Care Management (Signed)
[facility] Individual Statement of Services  Patient Name:  MARIAM HELBERT  Date:  12/01/2019  Welcome to the [facility].  Our goal is to provide you with an individualized program based on your diagnosis and situation, designed to meet your specific needs.  With this comprehensive rehabilitation program, you will be expected to participate in at least 3 hours of rehabilitation therapies Monday-Friday, with modified therapy programming on the weekends.  Your rehabilitation program will include the following services:  Physical Therapy (PT), Occupational Therapy (OT), Speech Therapy (ST), 24 hour per day rehabilitation nursing, Therapeutic Recreaction (TR), Neuropsychology, Case Management (Social Worker), Rehabilitation Medicine, Nutrition Services and Pharmacy Services  Weekly team conferences will be held on Tuesdays to discuss your progress.  Your Social Worker will talk with you frequently to get your input and to update you on team discussions.  Team conferences with you and your family in attendance may also be held.  Expected length of stay: 7-10 days   Overall anticipated outcome: supervision  Depending on your progress and recovery, your program may change. Your Social Worker will coordinate services and will keep you informed of any changes. Your Social Worker's name and contact numbers are listed  below.  The following services may also be recommended but are not provided by the [facility]:   Driving Evaluations  Home Health Rehabiltiation Services  Outpatient Rehabilitation Services  Vocational Rehabilitation   Arrangements will be made to provide these services after discharge if needed.  Arrangements include referral to agencies that provide these services.  Your insurance has been verified to be:  [employer] of [place] Your primary doctor is:    Pertinent information will be shared with your doctor and your insurance  company.  Social Worker:  [place], [place] [phone] or [phone]   Information discussed with and copy given to patient by: Amada Jupiter, 12/01/2019, 3:56 PM
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
## 2019-12-01 NOTE — Plan of Care (Signed)
  Problem: Consults Goal: RH BRAIN INJURY PATIENT EDUCATION Description: Description: See Patient Education module for eduction specifics Outcome: Progressing Goal: Skin Care Protocol Initiated - if Braden Score 18 or less Description: If consults are not indicated, leave blank or document N/A Outcome: Progressing   Problem: RH SKIN INTEGRITY Goal: RH STG SKIN FREE OF INFECTION/BREAKDOWN Description: Patients skin will remain free from further infection or breakdown with min assist. Outcome: Progressing Goal: RH STG MAINTAIN SKIN INTEGRITY WITH ASSISTANCE Description: STG Maintain Skin Integrity With min Assistance. Outcome: Progressing   Problem: RH SAFETY Goal: RH STG ADHERE TO SAFETY PRECAUTIONS W/ASSISTANCE/DEVICE Description: STG Adhere to Safety Precautions With mod Assistance/Device. Outcome: Progressing Goal: RH STG DECREASED RISK OF FALL WITH ASSISTANCE Description: STG Decreased Risk of Fall With mod Assistance. Outcome: Progressing   Problem: RH COGNITION-NURSING Goal: RH STG USES MEMORY AIDS/STRATEGIES W/ASSIST TO PROBLEM SOLVE Description: STG Uses Memory Aids/Strategies With cues Assistance to Problem Solve. Outcome: Progressing Goal: RH STG ANTICIPATES NEEDS/CALLS FOR ASSIST W/ASSIST/CUES Description: STG Anticipates Needs/Calls for Assist With min Assistance/Cues. Outcome: Progressing   Problem: RH PAIN MANAGEMENT Goal: RH STG PAIN MANAGED AT OR BELOW PT'S PAIN GOAL Description: < 3 Outcome: Progressing   Problem: RH KNOWLEDGE DEFICIT BRAIN INJURY Goal: RH STG INCREASE KNOWLEDGE OF SELF CARE AFTER BRAIN INJURY Description: Patient/caregiver will verbalize understanding of management of TBI including stages of recovery, safety plan, cognitive impairments, home modifications, and follow up appointments with min assist. Outcome: Progressing   
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
## 2019-12-02 ENCOUNTER — Inpatient Hospital Stay (HOSPITAL_COMMUNITY): Payer: BC Managed Care – PPO | Admitting: Speech Pathology

## 2019-12-02 ENCOUNTER — Encounter (HOSPITAL_COMMUNITY): Payer: BC Managed Care – PPO | Admitting: Psychology

## 2019-12-02 ENCOUNTER — Inpatient Hospital Stay (HOSPITAL_COMMUNITY): Payer: BC Managed Care – PPO

## 2019-12-02 ENCOUNTER — Inpatient Hospital Stay (HOSPITAL_COMMUNITY): Payer: BC Managed Care – PPO | Admitting: Physical Therapy

## 2019-12-02 DIAGNOSIS — F09 Unspecified mental disorder due to known physiological condition: Secondary | ICD-10-CM

## 2019-12-02 LAB — BASIC METABOLIC PANEL WITH GFR
Anion gap: 8 (ref 5–15)
BUN: 10 mg/dL (ref 6–20)
CO2: 29 mmol/L (ref 22–32)
Calcium: 9.3 mg/dL (ref 8.9–10.3)
Chloride: 102 mmol/L (ref 98–111)
Creatinine, Ser: 0.65 mg/dL (ref 0.44–1.00)
GFR calc Af Amer: >60 mL/min
GFR calc non Af Amer: >60 mL/min
Glucose, Bld: 104 mg/dL — ABNORMAL HIGH (ref 70–99)
Potassium: 3.7 mmol/L (ref 3.5–5.1)
Sodium: 139 mmol/L (ref 135–145)

## 2019-12-02 LAB — CBC
HCT: 34.6 % — ABNORMAL LOW (ref 36.0–46.0)
Hemoglobin: 11.8 g/dL — ABNORMAL LOW (ref 12.0–15.0)
MCH: 29.8 pg (ref 26.0–34.0)
MCHC: 34.1 g/dL (ref 30.0–36.0)
MCV: 87.4 fL (ref 80.0–100.0)
Platelets: 272 10*3/uL (ref 150–400)
RBC: 3.96 MIL/uL (ref 3.87–5.11)
RDW: 12.3 % (ref 11.5–15.5)
WBC: 4.8 10*3/uL (ref 4.0–10.5)
nRBC: 0 % (ref 0.0–0.2)
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
## 2019-12-02 NOTE — Progress Notes (Addendum)
Physical Therapy Session Note  Patient Details  Name: Maria Daniels MRN: [id number] Date of Birth: 1991-06-14  Today's Date: 12/02/2019 PT Individual Time: 7035-0093 PT Individual Time Calculation (min): 54 min   Short Term Goals: Week 1:  PT Short Term Goal 1 (Week 1): =LTGs due to ELOS  Skilled Therapeutic Interventions/Progress Updates:   Pt in supine and agreeable to therapy, denies pain but reports some soreness/discomfort in jaw. Ambulated around unit w/ distant supervision, >150' at a time w/o AD. Occasional verbal cues for environmental awareness, otherwise pt w/ much improved safety awareness and awareness in general. Performed Berg Balance Scale and Functional Gait Assessment as detailed below. Pt is not a significant fall risk, <45 on Berg and <22 on FGA is indicative of increased fall risk. Remainder of session focused on dynamic standing balance and higher level gait, emphasis on ankle balance strategies, balance on compliant surface, single leg balance, and dual task. Ambulated in multiple 150-300' bouts w/ close supervision while performing heel-to-toe gait, tossing ball back and forth w/ therapist, stating alphabet backwards, and while holding full cups of water bilaterally. Biggest challenge to balance comes w/ narrow BOS and dual task, pt able to correct multiple LOBs, but LOB would not be normal for an individual of her age and PLOF. Standing balance included tossing ball on foam surface, kicking ball on firm surface, and limits of stability training on biodex. Returned to room and ended session in supine, all needs in reach. Pt w/ 6/10 jaw pain at end of session, made RN aware that pt was requesting pain medication.   Also discussed w/ treatment team regarding unzipping of enclosure bed during day. This therapist agrees that pt is safe w/ enclosure bed unzipped, however would recommend pt at least have bed alarm or 1:1 sitter in place for safety 2/2 recent history of attempting to  remove wires in jaw - treatment team made aware of recommendation.   Therapy Documentation Precautions:  Precautions Precautions: Fall Precaution Comments: enclosure bed 2/2 impulsivity, jaw wired shut Restrictions Weight Bearing Restrictions: No Pain: Pain Assessment Pain Scale: Faces Faces Pain Scale: Hurts a little bit Pain Type: Acute pain Pain Location: Mouth Pain Descriptors / Indicators: Aching Pain Onset: On-going Pain Intervention(s): Other (Comment);Emotional support(rest- session ended early at her request) Multiple Pain Sites: No Balance: Balance Balance Assessed: Yes Standardized Balance Assessment Standardized Balance Assessment: Berg Balance Test;Functional Gait Assessment Berg Balance Test Sit to Stand: Able to stand without using hands and stabilize independently Standing Unsupported: Able to stand safely 2 minutes Sitting with Back Unsupported but Feet Supported on Floor or Stool: Able to sit safely and securely 2 minutes Stand to Sit: Sits safely with minimal use of hands Transfers: Able to transfer safely, minor use of hands Standing Unsupported with Eyes Closed: Able to stand 10 seconds safely Standing Ubsupported with Feet Together: Able to place feet together independently and stand 1 minute safely From Standing, Reach Forward with Outstretched Arm: Can reach confidently >25 cm (10") From Standing Position, Pick up Object from Floor: Able to pick up shoe safely and easily From Standing Position, Turn to Look Behind Over each Shoulder: Looks behind from both sides and weight shifts well Turn 360 Degrees: Able to turn 360 degrees safely in 4 seconds or less Standing Unsupported, Alternately Place Feet on Step/Stool: Able to stand independently and safely and complete 8 steps in 20 seconds Standing Unsupported, One Foot in Front: Able to plae foot ahead of the other independently and hold  30 seconds Standing on One Leg: Able to lift leg independently and hold  equal to or more than 3 seconds Total Score: 53 Functional Gait  Assessment Gait assessed : Yes Gait Level Surface: Walks 20 ft in less than 5.5 sec, no assistive devices, good speed, no evidence for imbalance, normal gait pattern, deviates no more than 6 in outside of the 12 in walkway width. Change in Gait Speed: Able to change speed, demonstrates mild gait deviations, deviates 6-10 in outside of the 12 in walkway width, or no gait deviations, unable to achieve a major change in velocity, or uses a change in velocity, or uses an assistive device. Gait with Horizontal Head Turns: Performs head turns smoothly with slight change in gait velocity (eg, minor disruption to smooth gait path), deviates 6-10 in outside 12 in walkway width, or uses an assistive device. Gait with Vertical Head Turns: Performs head turns with no change in gait. Deviates no more than 6 in outside 12 in walkway width. Gait and Pivot Turn: Pivot turns safely within 3 sec and stops quickly with no loss of balance. Step Over Obstacle: Is able to step over 2 stacked shoe boxes taped together (9 in total height) without changing gait speed. No evidence of imbalance. Gait with Narrow Base of Support: Ambulates 4-7 steps. Gait with Eyes Closed: Walks 20 ft, uses assistive device, slower speed, mild gait deviations, deviates 6-10 in outside 12 in walkway width. Ambulates 20 ft in less than 9 sec but greater than 7 sec. Ambulating Backwards: Walks 20 ft, uses assistive device, slower speed, mild gait deviations, deviates 6-10 in outside 12 in walkway width. Steps: Alternating feet, no rail. Total Score: 24  Therapy/Group: Individual Therapy  Mattisyn Cardona Melton Krebs 12/02/2019, 1:56 PM

## 2019-12-02 NOTE — Plan of Care (Signed)
  Problem: Consults Goal: RH BRAIN INJURY PATIENT EDUCATION Description: Description: See Patient Education module for eduction specifics Outcome: Progressing Goal: Skin Care Protocol Initiated - if Braden Score 18 or less Description: If consults are not indicated, leave blank or document N/A Outcome: Progressing   Problem: RH SKIN INTEGRITY Goal: RH STG SKIN FREE OF INFECTION/BREAKDOWN Description: Patients skin will remain free from further infection or breakdown with min assist. Outcome: Progressing Goal: RH STG MAINTAIN SKIN INTEGRITY WITH ASSISTANCE Description: STG Maintain Skin Integrity With min Assistance. Outcome: Progressing   Problem: RH SAFETY Goal: RH STG ADHERE TO SAFETY PRECAUTIONS W/ASSISTANCE/DEVICE Description: STG Adhere to Safety Precautions With mod Assistance/Device. Outcome: Progressing Goal: RH STG DECREASED RISK OF FALL WITH ASSISTANCE Description: STG Decreased Risk of Fall With mod Assistance. Outcome: Progressing   Problem: RH COGNITION-NURSING Goal: RH STG USES MEMORY AIDS/STRATEGIES W/ASSIST TO PROBLEM SOLVE Description: STG Uses Memory Aids/Strategies With cues Assistance to Problem Solve. Outcome: Progressing Goal: RH STG ANTICIPATES NEEDS/CALLS FOR ASSIST W/ASSIST/CUES Description: STG Anticipates Needs/Calls for Assist With min Assistance/Cues. Outcome: Progressing   Problem: RH PAIN MANAGEMENT Goal: RH STG PAIN MANAGED AT OR BELOW PT'S PAIN GOAL Description: < 3 Outcome: Progressing   Problem: RH KNOWLEDGE DEFICIT BRAIN INJURY Goal: RH STG INCREASE KNOWLEDGE OF SELF CARE AFTER BRAIN INJURY Description: Patient/caregiver will verbalize understanding of management of TBI including stages of recovery, safety plan, cognitive impairments, home modifications, and follow up appointments with min assist. Outcome: Progressing

## 2019-12-02 NOTE — Progress Notes (Signed)
Occupational Therapy Session Note  Patient Details  Name: Maria Daniels MRN: [id number] Date of Birth: February 22, 1991  Today's Date: 12/02/2019 OT Individual Time: 0900-1000 OT Individual Time Calculation (min): 60 min    Short Term Goals: Week 1:  OT Short Term Goal 1 (Week 1): STG=LTG d/t ELOS  Skilled Therapeutic Interventions/Progress Updates:    Pt received supine in enclosure bed with no c/o pain, agreeable to session. Pt completed shower at (S) level overall. Min cueing for preparation of items needed, including shampoo and clothes. Pt with no LOB throughout standing level shower. Pt able to dress with (S) in standing. Pt completed functional mobiltiy throughout session at (S)- mod I level overall. Pt completed cognitive retraining task focused on working memory and spatial orientation. Pt with 75-85% accuracy overall. Pt with mild working memory deficits present throughout session. Pt with appropriate conversation with therapist and oriented. Pt completed gross motor strengthening circuit with a focus on dynamic standing balance for remainder of session. CGA provided during single leg stance but pt completed and followed instruction with (S) overall. Discussed d/c of enclosure bed with CSW and therapy team and discussed with pt- including fall policy on unit. Team will trial d/c of enclosure bed today. Pt left supine with all needs met.   Therapy Documentation Precautions:  Precautions Precautions: Fall Precaution Comments: enclosure bed 2/2 impulsivity, jaw wired shut Restrictions Weight Bearing Restrictions: No   Therapy/Group: Individual Therapy  Curtis Sites 12/02/2019, 7:15 AM

## 2019-12-02 NOTE — Progress Notes (Signed)
Speech Language Pathology Daily Session Note  Patient Details  Name: Maria Daniels MRN: [id number] Date of Birth: 06/03/1991  Today's Date: 12/02/2019 SLP Individual Time: 1115-1145 SLP Individual Time Calculation (min): 30 min  Short Term Goals: Week 1: SLP Short Term Goal 1 (Week 1): STGs=LTGs due to ELOS  Skilled Therapeutic Interventions: Pt was seen for skilled ST targeting cognitive goals. SLP facilitated session with 2 semi-complex to complex deductive reasoning puzzles to target problem solving and organizational skills. She required fluctuating Min A verbal and visual cues for problem solving, Mod A for organization throughout tasks. Pt demonstrated good anticipatory awareness during functional conversation regarding d/c needs and current cognitive deficits. Pt stated being able to organize information and prioritize is something she anticipated needing assistance with. Pt with increased jaw/mouth pain and fatigue from talking during session and requested to rest, therefore session ended 15 minutes early. Pt left laying in enclosure bed with needs within reach. Continue per current plan of care.        Pain Pain Assessment Pain Scale: Faces Faces Pain Scale: Hurts a Maria bit Pain Type: Acute pain Pain Location: Mouth Pain Descriptors / Indicators: Aching Pain Onset: On-going Pain Intervention(s): Other (Comment);Emotional support(rest- session ended early at her request) Multiple Pain Sites: No  Therapy/Group: Individual Therapy  Maria Daniels 12/02/2019, 12:19 PM
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
## 2019-12-03 ENCOUNTER — Inpatient Hospital Stay (HOSPITAL_COMMUNITY): Payer: BC Managed Care – PPO | Admitting: Speech Pathology

## 2019-12-03 ENCOUNTER — Inpatient Hospital Stay (HOSPITAL_COMMUNITY): Payer: BC Managed Care – PPO | Admitting: Physical Therapy

## 2019-12-03 ENCOUNTER — Inpatient Hospital Stay (HOSPITAL_COMMUNITY): Payer: BC Managed Care – PPO

## 2019-12-03 ENCOUNTER — Encounter (HOSPITAL_COMMUNITY): Payer: Self-pay | Admitting: Physical Medicine & Rehabilitation

## 2019-12-03 NOTE — Progress Notes (Signed)
Physical Therapy Discharge Summary  Patient Details  Name: Maria Daniels MRN: [id number] Date of Birth: 21-Oct-1991  Today's Date: 12/04/2019 PT Individual Time: 1100-1125 PT Individual Time Calculation (min): 25 min   Pt in supine and agreeable to therapy, reports pain in jaw (premedicated). Parents present for family education. Educated both on providing supervision level assist for household and community environments including gait over firm and uneven surfaces, stair negotiation, and car transfer. Pt is independent for bed mobility and transfers. Educated both on pt's very high level balance deficits that are not normal for her PLOF and age. Discussed ways to continue working on balance w/ direct supervision including incorporating romberg, tandem, and SLS into functional activities such as making lunch at counter, brushing teeth, etc. Both in agreement and verbalized understanding of current deficits. Discussed that pt is safe to ambulate on uneven surfaces such as paved trail w/ supervision, but a hiking trail would be unsafe. All in agreement. Answered all questions to their satisfaction. Pt ended session in supine, all needs in reach.   Patient has met 11 of 11 long term goals due to improved balance, improved postural control, improved attention and improved awareness.  Patient to discharge at an ambulatory level Supervision in household and community environments.  Patient's care partner is independent to provide the necessary physical and cognitive assistance at discharge.  Reasons goals not met: n/a  Recommendation:  Patient will benefit from ongoing skilled PT services in outpatient setting to continue to advance safe functional mobility, address ongoing impairments in higher-level balance, safety awareness, dual-tasking abilities, and minimize fall risk.  Equipment: No equipment provided  Reasons for discharge: treatment goals met and discharge from hospital  Patient/family  agrees with progress made and goals achieved: Yes  PT Discharge Precautions/Restrictions Precautions Precautions: Fall Restrictions Weight Bearing Restrictions: No Vision/Perception  Perception Perception: Within Functional Limits Praxis Praxis: Intact  Cognition Overall Cognitive Status: Impaired/Different from baseline Arousal/Alertness: Awake/alert Orientation Level: Oriented X4 Attention: Sustained;Selective Sustained Attention: Appears intact Selective Attention: Impaired Selective Attention Impairment: Verbal complex Memory: Impaired Memory Impairment: Storage deficit;Decreased recall of new information;Decreased short term memory Decreased Short Term Memory: Functional basic;Verbal basic Awareness Impairment: Emergent impairment Problem Solving: Impaired Problem Solving Impairment: Verbal complex;Functional complex Executive Function: Self Monitoring;Organizing Organizing: Impaired Organizing Impairment: Verbal complex Self Monitoring Impairment: Verbal complex;Functional complex Self Correcting: Impaired Self Correcting Impairment: Verbal complex;Functional complex Safety/Judgment: Impaired [employer] Scales of Cognitive Functioning: Purposeful/appropriate Sensation Sensation Light Touch: Appears Intact Coordination Gross Motor Movements are Fluid and Coordinated: Yes Motor  Motor Motor: Within Functional Limits  Mobility Bed Mobility Bed Mobility: Rolling Right;Supine to Sit;Sit to Supine;Rolling Left Rolling Right: Independent Rolling Left: Independent Supine to Sit: Independent Sit to Supine: Independent Transfers Transfers: Sit to Stand;Stand Pivot Transfers;Stand to Sit Sit to Stand: Independent Stand to Sit: Independent Stand Pivot Transfers: Independent Transfer (Assistive device): None Locomotion  Gait Ambulation: Yes Gait Assistance: Supervision/Verbal cueing(supervision in household and community environments) Social research officer, government (Feet):  1000 Feet Assistive device: None Gait Gait: Yes Gait Pattern: Impaired Gait Pattern: Scissoring;Narrow base of support(much improved since evaluation, only intermittently present) High Level Ambulation High Level Ambulation - Other Comments: Dual task - ongoing effect on safety and quality of gait, however improved since eval Stairs / Additional Locomotion Stairs: Yes Stairs Assistance: Supervision/Verbal cueing Stair Management Technique: No rails Number of Stairs: 12 Height of Stairs: 6 Ramp: Supervision/Verbal cueing Curb: Supervision/Verbal cueing Wheelchair Mobility Wheelchair Mobility: No  Trunk/Postural Assessment  Cervical Assessment Cervical Assessment:  Within Functional Limits Thoracic Assessment Thoracic Assessment: Within Functional Limits Lumbar Assessment Lumbar Assessment: Within Functional Limits Postural Control Postural Control: Within Functional Limits  Balance Balance Balance Assessed: Yes Dynamic Sitting Balance Dynamic Sitting - Balance Support: No upper extremity supported;Feet supported Dynamic Sitting - Level of Assistance: 7: Independent Dynamic Standing Balance Dynamic Standing - Balance Support: During functional activity;No upper extremity supported Dynamic Standing - Level of Assistance: 5: Stand by assistance Extremity Assessment  RLE Assessment RLE Assessment: Within Functional Limits LLE Assessment LLE Assessment: Within Functional Limits    Kaian Fahs K Khyra Viscuso 12/03/2019, 6:06 PM

## 2019-12-03 NOTE — Progress Notes (Signed)
Occupational Therapy Session Note  Patient Details  Name: Maria Daniels MRN: [id number] Date of Birth: 1991-10-13  Today's Date: 12/03/2019 OT Individual Time: 1445-1530 OT Individual Time Calculation (min): 45 min    Short Term Goals: Week 1:  OT Short Term Goal 1 (Week 1): STG=LTG d/t ELOS  Skilled Therapeutic Interventions/Progress Updates:    Pt received supine in bed with no c/o pain, agreeable to OT session. Pt completed toileting tasks mod I. Pt sat in dayroom and completed meal planning task, creating potential meal, finding ingredients online, and budgeting. Pt required min A overall to complete task. 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 Discussed return to work and practicing typing. Pt completed Wii Fit activity using step, completing several Yoga poses for dynamic standing balance and core stabilization. Discussed carryover to HEP. Pt ended session with full body strengthening exercises holding 3 lb dumbbells. Pt returned to her room and was left supine with all needs met.   Therapy Documentation Precautions:  Precautions Precautions: Fall Precaution Comments: enclosure bed 2/2 impulsivity, jaw wired shut Restrictions Weight Bearing Restrictions: No   Therapy/Group: Individual Therapy  Curtis Sites 12/03/2019, 2:50 PM

## 2019-12-03 NOTE — Progress Notes (Signed)
Pt slept well throughout night, compliant and cooperative, may benefit from removal of enclosure bed
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
## 2019-12-03 NOTE — Progress Notes (Signed)
Occupational Therapy Session Note  Patient Details  Name: Maria Daniels MRN: [id number] Date of Birth: 1991/11/16  Today's Date: 12/03/2019 OT Individual Time: 3734-2876 OT Individual Time Calculation (min): 54 min    Short Term Goals: Week 1:  OT Short Term Goal 1 (Week 1): STG=LTG d/t ELOS  Skilled Therapeutic Interventions/Progress Updates:    Pt received supine, pleasant and oriented. Discussed d/c planning. Pt completed shower at mod I level overall. No concerns for safety and pt demonstrating appropriate sequencing and initiation/termination of tasks. Pt donned all clothing mod I overall. Pt completed trail finding task- utilizing working memory to remember directions to hospital giftshop. Pt required min cueing for memory and for use of outside aids, I.e. map on the wall to find specific set of elevators. Pt completed 500+ ft of functional mobility at mod I level. Pt demonstrated improved safety awareness and impulsivity this session, waiting for therapist to return to get OOB. Pt completed dual processing task- completing Nustep pedaling for functional activity tolerance while pt completed auditory and visual memory task, as well as working memory and abstract sequencing. Pt with 85% accuracy, fair-poor recognition of errors. Pt returned to her room and was left in unzipped enclosure bed with all needs met.   Therapy Documentation Precautions:  Precautions Precautions: Fall Precaution Comments: enclosure bed 2/2 impulsivity, jaw wired shut Restrictions Weight Bearing Restrictions: No  Therapy/Group: Individual Therapy  Curtis Sites 12/03/2019, 6:53 AM

## 2019-12-03 NOTE — Progress Notes (Signed)
Speech Language Pathology Daily Session Note  Patient Details  Name: TAVARIA MACKINS MRN: [id number] Date of Birth: Jun 24, 1991  Today's Date: 12/03/2019 SLP Individual Time: 9198-0221 SLP Individual Time Calculation (min): 29 min  Short Term Goals: Week 1: SLP Short Term Goal 1 (Week 1): STGs=LTGs due to ELOS  Skilled Therapeutic Interventions: Pt was seen for skilled ST targeting cognitive goals. SLP facilitated session with administration of MOCA version 7.1 to assess progress since admission. Pt received a score of 20/30 (a score of 26 or greater is considered [facility]). Most noteworthy deficits noted on short term recall subtest - categorical or multiple choice cues (Mod-Max) required for recall of 3/5 words, Total A required for recall of remaining 2. Pt also presented with deficits in executive function, abstract thinking, and orientation to date. Results were reviewed with pt, although she reported she did not feel she is functionally impacted by short term memory deficits. SLP reviewed ST goal for short term memory and potential impact of deficits upon return home and in work setting. Pt left laying in veil bed (but veil left open and pt is Mod I to ambulate around room). Continue per current plan of care.      Pain Pain Assessment Pain Scale: Faces Faces Pain Scale: No hurt  Therapy/Group: Individual Therapy  Little Ishikawa 12/03/2019, 1:30 PM

## 2019-12-03 NOTE — Progress Notes (Signed)
Physical Therapy Session Note  Patient Details  Name: Maria Daniels MRN: [id number] Date of Birth: 05/16/1991  Today's Date: 12/03/2019 PT Individual Time: 6237-6283 PT Individual Time Calculation (min): 70 min   Short Term Goals: Week 1:  PT Short Term Goal 1 (Week 1): =LTGs due to ELOS  Skilled Therapeutic Interventions/Progress Updates:   Pt in supine and agreeable to therapy, pain 6-7/10 in jaw (premedicated). Ambulated around unit w/ distant supervision w/o AD, >150' at a time. Ambulated to outside of hospital and negotiated uneven surfaces and 8 steps w/o rails, both w/ supervision. Returned to unit and practiced floor transfer, pt also performed this w/ supervision. Worked on dynamic standing balance remainder of session. Performed multiple static and dynamic standing balance tasks on airex pad. Performed both in romberg and in tandem w/ close supervision. Tasks including ball toss, simple pipe tree, and vertical puzzle tasks. Pipe tree tasks performed w/ no visual cues other than prior to initiating activity to work on [employer], needed 2-3 min verbal cues for memory and problem solving. Also performed Wii bowling on firm surface w/ distant supervision, stood 10+ minutes w/o rest break. Performed 100 piece puzzle on floor of pt's room to work on attention to task, visual/spatial awareness, and sitting balance moving in and out of quadruped, kneeling, and sitting on bottom. All physical tasks performed on floor w/ mod/i. Needed min-mod visual and tactile cues for successful completion of puzzle. Pt is also mod/i when ambulating in controlled environment - hospital room, but supervision in community environment - hospital hallway. Discussed w/ OT, SLP, MD, and RN and all in agreement pt is safe to ambulate in room w/o supervision. Ended session in supine, all needs in reach.   Therapy Documentation Precautions:  Precautions Precautions: Fall Precaution Comments: enclosure bed 2/2 impulsivity, jaw  wired shut Restrictions Weight Bearing Restrictions: No  Therapy/Group: Individual Therapy  Ogden Handlin Melton Krebs 12/03/2019, 11:55 AM
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
## 2019-12-04 ENCOUNTER — Encounter (HOSPITAL_COMMUNITY): Payer: BC Managed Care – PPO

## 2019-12-04 ENCOUNTER — Encounter (HOSPITAL_COMMUNITY): Payer: BC Managed Care – PPO | Admitting: Speech Pathology

## 2019-12-04 ENCOUNTER — Ambulatory Visit (HOSPITAL_COMMUNITY): Payer: BC Managed Care – PPO | Admitting: Physical Therapy

## 2019-12-04 MED ORDER — AMPHETAMINE-DEXTROAMPHETAMINE 30 MG PO TABS: 30.0000 mg | ORAL_TABLET | Freq: Two times a day (BID) | ORAL | 0 refills | Status: DC

## 2019-12-04 MED ORDER — ACETAMINOPHEN 160 MG/5ML PO SOLN: 1000.0000 mg | Freq: Four times a day (QID) | ORAL | 0 refills | Status: DC

## 2019-12-04 MED ORDER — CHLORHEXIDINE GLUCONATE 0.12 % MT SOLN: 15.0000 mL | Freq: Four times a day (QID) | OROMUCOSAL | 0 refills | Status: DC

## 2019-12-04 MED ORDER — OXYCODONE HCL 5 MG PO TABS: 2.5000 mg | ORAL_TABLET | Freq: Two times a day (BID) | ORAL | 0 refills | Status: DC | PRN

## 2019-12-04 MED ORDER — QUETIAPINE FUMARATE 50 MG PO TABS: 50.0000 mg | ORAL_TABLET | Freq: Every day | ORAL | 0 refills | Status: DC

## 2019-12-04 MED ORDER — ASPIRIN 81 MG PO CHEW: 81.0000 mg | CHEWABLE_TABLET | Freq: Every day | ORAL | 0 refills | Status: DC

## 2019-12-04 MED ORDER — GABAPENTIN 250 MG/5ML PO SOLN: 100.0000 mg | Freq: Two times a day (BID) | ORAL | 1 refills | Status: DC

## 2019-12-04 MED ORDER — METHOCARBAMOL 500 MG PO TABS: 500.0000 mg | ORAL_TABLET | Freq: Three times a day (TID) | ORAL | 0 refills | Status: DC
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
## 2019-12-04 NOTE — Progress Notes (Signed)
Speech Language Pathology Discharge Summary  Patient Details  Name: Maria Daniels MRN: [id number] Date of Birth: [birth year]  Today's Date: 12/04/2019 SLP Individual Time: 1030-1055 SLP Individual Time Calculation (min): 25 min   Skilled Therapeutic Interventions:  Skilled treatment session focused on completion of patient and family education with the patient's parents. All were educated in regards to patient's current cognitive functioning and strategies to utilize at home to maximize recall, carryover, functional independence, and overall safety. All verbalized understanding of information and handouts were also given for reinforcement of information. Patient will discharge home today with 24 hour supervision from family.   Patient has met 4 of 4 long term goals.  Patient to discharge at overall Supervision;Min level.   Reasons goals not met: N/A   Clinical Impression/Discharge Summary: Patient has made excellent gains and has met 4 of 4 LTGs this admission. Currently, patient demonstrates behaviors consistent with a Rancho Level VIII and requires overall supervision-Min A verbal cues for basic problem solving, selective attention, and recall with use of external aids. Patient also demonstrates improved intelligibility due to a slower speech rate at the sentence level. Patient and family education is complete and patient will discharge home with 24 hour supervision from family. Patient would benefit from f/u SLP services to maximize her cognitive functioning and overall functional independence.   Care Partner:  Caregiver Able to Provide Assistance: Yes  Type of Caregiver Assistance: Physical;Cognitive  Recommendation:  Outpatient SLP;24 hour supervision/assistance  Rationale for SLP Follow Up: Maximize cognitive function and independence   Equipment: N/A   Reasons for discharge: Discharged from hospital;Treatment goals met   Patient/Family Agrees with Progress Made and Goals Achieved:  Yes    Lynd, [place] 12/04/2019, 4:42 PM
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
## 2019-12-04 NOTE — Discharge Instructions (Signed)
Inpatient Rehab Discharge Instructions  Maria Daniels Discharge date and time:  12/04/19  Activities/Precautions/ Functional Status: Activity: no lifting, driving, or strenuous exercise till cleared by MD Diet: liquid diet Wound Care: Keep wound clean and dry. Need to do oral care after all meals--use biotene or other mouthwasth  Functional status:  ___ No restrictions     ___ Walk up steps independently ___ 24/7 supervision/assistance   ___ Walk up steps with assistance _X__ Intermittent supervision/assistance  ___ Bathe/dress independently ___ Walk with walker     ___ Bathe/dress with assistance ___ Walk Independently    ___ Shower independently ___ Walk with assistance    _X__ Shower with assistance _X__ No alcohol     ___ Return to work/school ________   COMMUNITY REFERRALS UPON DISCHARGE:    Outpatient: PT     OT    ST                   Agency:  [facility]   Phone: [phone]               Appointment Date/Time:  1/15 @ 1:15 (speech) and 3:30 (physical therapy)   Special Instructions: 1. Family to oversee with medication management.  2. Use dental wax on wires to help with discomfort.  3. Oral care after any intake.   My questions have been answered and I understand these instructions. I will adhere to these goals and the provided educational materials after my discharge from the hospital.  Patient/Caregiver Signature _______________________________ Date __________  Clinician Signature _______________________________________ Date __________  Please bring this form and your medication list with you to all your follow-up doctor's appointments.
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
## 2019-12-04 NOTE — Progress Notes (Signed)
Social Work Discharge Note   The overall goal for the admission was met for:   Discharge location: Yes - d/c to parents' home where 24/7 supervision can be provided  Length of Stay: Yes - 7 days  Discharge activity level: Yes - supervision  Home/community participation: Yes  Services provided included: MD, RD, PT, OT, SLP, RN, TR, Pharmacy, Neuropsych and SW  Financial Services: Private Insurance: [place]  Follow-up services arranged: Outpatient: PT, OT, ST via [facility] and Patient/Family has no preference for HH/DME agencies  Comments (or additional information):  Contact info - mother, Doree Kuehne @ [phone]  Patient/Family verbalized understanding of follow-up arrangements: Yes  Individual responsible for coordination of the follow-up plan: pt/ parent  Confirmed correct DME delivered:  NA    Maria Daniels

## 2019-12-04 NOTE — Progress Notes (Signed)
Patient and family received discharge instructions from Marissa Nestle, PA-C with verbal understanding. Patient discharged to home with parents and belongings.

## 2019-12-04 NOTE — Progress Notes (Signed)
Occupational Therapy Discharge Summary  Patient Details  Name: Maria Daniels MRN: [id number] Date of Birth: 1991-03-07  Today's Date: 12/04/2019 OT Individual Time: 1000-1030 OT Individual Time Calculation (min): 30 min    Patient has met 11 of 11 long term goals due to improved activity tolerance, improved balance, postural control, ability to compensate for deficits, improved attention, improved awareness and improved coordination.  Patient to discharge at overall Supervision level.  Patient's care partner is independent to provide the necessary cognitive assistance at discharge.  Family education completed.   Reasons goals not met: All goals met.   Recommendation:  Patient will benefit from ongoing skilled OT services in outpatient setting to continue to advance functional skills in the area of BADL.  Equipment: No equipment provided  Reasons for discharge: treatment goals met and discharge from hospital  Patient/family agrees with progress made and goals achieved: Yes   Skilled OT Intervention: Family education session completed. Focus of session on TBI education- including current level of functioning, need for (S), risk of depression, substance abuse, and potential lapse in judgement. Pt's family had several questions and was very engaged in education. Pt mod I for all ADLs, recommend (S) with IADL tasks. OPOT to follow. Pt supine throughout session and also actively involved in session   OT Discharge Precautions/Restrictions  Precautions Precautions: Fall Restrictions Weight Bearing Restrictions: No Pain Pain Assessment Pain Scale: 0-10 Pain Score: 0-No pain ADL  All ADLs performed at mod I level.  Vision Baseline Vision/History: No visual deficits Patient Visual Report: No change from baseline Vision Assessment?: No apparent visual deficits Perception  Perception: Within Functional Limits Praxis Praxis: Intact Cognition Overall Cognitive Status:  Impaired/Different from baseline Arousal/Alertness: Awake/alert Orientation Level: Oriented X4 Attention: Sustained;Selective Sustained Attention: Appears intact Selective Attention: Impaired Selective Attention Impairment: Verbal complex Memory: Impaired Memory Impairment: Storage deficit;Decreased recall of new information;Decreased short term memory Decreased Short Term Memory: Functional basic;Verbal basic Awareness Impairment: Emergent impairment Problem Solving: Impaired Problem Solving Impairment: Verbal complex;Functional complex Executive Function: Self Monitoring;Organizing Organizing: Impaired Organizing Impairment: Verbal complex Self Monitoring Impairment: Verbal complex;Functional complex Self Correcting: Impaired Self Correcting Impairment: Verbal complex;Functional complex Safety/Judgment: Impaired [employer] Scales of Cognitive Functioning: Purposeful/appropriate Sensation Sensation Light Touch: Appears Intact Coordination Gross Motor Movements are Fluid and Coordinated: Yes Fine Motor Movements are Fluid and Coordinated: Yes Motor  Motor Motor: Within Functional Limits Mobility  Bed Mobility Bed Mobility: Rolling Right;Supine to Sit;Sit to Supine;Rolling Left Rolling Right: Independent Rolling Left: Independent Supine to Sit: Independent Sit to Supine: Independent Transfers Sit to Stand: Independent Stand to Sit: Independent  Trunk/Postural Assessment  Cervical Assessment Cervical Assessment: Within Functional Limits Thoracic Assessment Thoracic Assessment: Within Functional Limits Lumbar Assessment Lumbar Assessment: Within Functional Limits Postural Control Postural Control: Within Functional Limits  Balance Balance Balance Assessed: Yes Static Sitting Balance Static Sitting - Balance Support: Feet supported Dynamic Sitting Balance Dynamic Sitting - Balance Support: No upper extremity supported;Feet supported Dynamic Sitting - Level  of Assistance: 7: Independent Dynamic Sitting - Balance Activities: Reaching for objects Static Standing Balance Static Standing - Balance Support: During functional activity Static Standing - Level of Assistance: 6: Modified independent (Device/Increase time) Dynamic Standing Balance Dynamic Standing - Balance Support: During functional activity;No upper extremity supported Dynamic Standing - Level of Assistance: 6: Modified independent (Device/Increase time) Extremity/Trunk Assessment RUE Assessment RUE Assessment: Within Functional Limits LUE Assessment LUE Assessment: Within Functional Limits   Curtis Sites 12/04/2019, 12:54 PM

## 2019-12-05 ENCOUNTER — Telehealth: Payer: Self-pay

## 2019-12-05 ENCOUNTER — Ambulatory Visit: Payer: Self-pay | Admitting: Orthopedic Surgery

## 2019-12-05 NOTE — Telephone Encounter (Signed)
Transitional Care call--Gina-mother    1. Are you/is patient experiencing any problems since coming home? No Are there any questions regarding any aspect of care? No 2. Are there any questions regarding medications administration/dosing? No Are meds being taken as prescribed? Yes, waiting on Gabapentin liquid to come in Patient should review meds with caller to confirm 3. Have there been any falls? No 4. Has Home Health been to the house and/or have they contacted you? Outpatient rehab If not, have you tried to contact them? Can we help you contact them? 5. Are bowels and bladder emptying properly? Yes Are there any unexpected incontinence issues? No If applicable, is patient following bowel/bladder programs? 6. Any fevers, problems with breathing, unexpected pain? No 7. Are there any skin problems or new areas of breakdown? No 8. Has the patient/family member arranged specialty MD follow up (ie cardiology/neurology/renal/surgical/etc)? In the process  Can we help arrange? 9. Does the patient need any other services or support that we can help arrange? No 10. Are caregivers following through as expected in assisting the patient? Yes 11. Has the patient quit smoking, drinking alcohol, or using drugs as recommended? Yes  Appointment time 2:40 pm, arrive time 2:20 pm with Dr. Riley Kill [address]

## 2019-12-05 NOTE — Telephone Encounter (Signed)
While I was doing Transitional Care Call for patient with mother Maria Daniels she stated she would like for you to call her before 12/11/2019 appt. She has several questions to ask you that her and patient was discussing about her care. [phone]

## 2019-12-06 ENCOUNTER — Other Ambulatory Visit: Payer: Self-pay

## 2019-12-06 ENCOUNTER — Ambulatory Visit: Payer: BLUE CROSS/BLUE SHIELD | Admitting: Speech Pathology

## 2019-12-06 ENCOUNTER — Ambulatory Visit: Payer: BLUE CROSS/BLUE SHIELD | Attending: Physician Assistant

## 2019-12-06 DIAGNOSIS — R41841 Cognitive communication deficit: Secondary | ICD-10-CM

## 2019-12-06 DIAGNOSIS — M6281 Muscle weakness (generalized): Secondary | ICD-10-CM | POA: Diagnosis present

## 2019-12-06 DIAGNOSIS — R2681 Unsteadiness on feet: Secondary | ICD-10-CM | POA: Diagnosis present

## 2019-12-06 DIAGNOSIS — R42 Dizziness and giddiness: Secondary | ICD-10-CM | POA: Insufficient documentation

## 2019-12-06 DIAGNOSIS — R2689 Other abnormalities of gait and mobility: Secondary | ICD-10-CM | POA: Insufficient documentation

## 2019-12-06 NOTE — Telephone Encounter (Signed)
Called and left VM.  Will try again later today.
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
## 2019-12-06 NOTE — Patient Instructions (Signed)
  Cognitive Activities you can do at home:   - Solitaire  - Majong  - Scrabble  - Chess/Checkers  - Crosswords (easy level)  - [employer]  - Card Games  - Board Games  - Connect 4  - Simon  - the Memory Game  - Dominoes  - Backgammon  - Jig saws

## 2019-12-09 ENCOUNTER — Telehealth: Payer: Self-pay | Admitting: Plastic Surgery

## 2019-12-09 MED ORDER — MORPHINE SULFATE (CONCENTRATE) 20 MG/ML PO SOLN: 10.0000 mg | ORAL | 0 refills | Status: AC | PRN

## 2019-12-09 MED ORDER — MORPHINE SULFATE (CONCENTRATE) 20 MG/ML PO SOLN: 10.0000 mg | Freq: Four times a day (QID) | ORAL | 0 refills | Status: DC | PRN

## 2019-12-09 MED ORDER — OXYCODONE HCL 5 MG/5ML PO SOLN: 5.0000 mg | ORAL | 0 refills | Status: AC | PRN

## 2019-12-09 NOTE — Telephone Encounter (Signed)
Pharmacy clarification
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
## 2019-12-09 NOTE — Telephone Encounter (Signed)
Mom called due to pain not controlled at all.  I sent in Morphine and Oxy.  Instructed mom on careful dosing and to give the first while Camiya can be monitored.  Not to be given first time right before bedtime.

## 2019-12-10 ENCOUNTER — Telehealth: Payer: Self-pay

## 2019-12-10 ENCOUNTER — Ambulatory Visit: Payer: BLUE CROSS/BLUE SHIELD | Admitting: Speech Pathology

## 2019-12-10 ENCOUNTER — Other Ambulatory Visit: Payer: Self-pay

## 2019-12-10 DIAGNOSIS — R2689 Other abnormalities of gait and mobility: Secondary | ICD-10-CM | POA: Diagnosis not present

## 2019-12-10 DIAGNOSIS — R41841 Cognitive communication deficit: Secondary | ICD-10-CM
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
## 2019-12-10 NOTE — Patient Instructions (Addendum)
Homework for [employer]:  -Set a recurring alert for 2 medications (can be an alarm or an event in your calendar) in your phone and start telling your mom when it's time to take your medicine.   -Add the due dates for bills coming up next month.   -Use the printed calendar with your therapy appointments and put them on your Google calendar (or another app of your choosing). Remember to slow down, double check that the time (AM/PM), date, and location are correct. Remember to set an alert for 30 minutes before so you can tell your mom it's time to get ready to go.   -Get a binder to organize your therapy materials and to write down any questions you have for your doctors.

## 2019-12-10 NOTE — Telephone Encounter (Signed)
Call returned to pt's Mom- Maria Daniels re: her concerns with the following- She reports that the pain is better controlled with the use of the liquid/oral pain meds- reports the pain level is 5/10- before it was 8/10 She is also using OTC Tylenol between for pain Mom reports that she has an "irritation/seroma" on inside of her lower lip Pt is taking small amounts of fluids/food- per Mom- around 800 calories of liquid/puree foods I encouraged her to try to increase pt's fluids & food if possible- this may be easier to achieve now that the pain is more controlled Mom is asking if pt should come in before her next f/u appointment on 12/20/19  I consulted with Dr. Ulice Bold re: her concerns  Per Dr. Ulice Bold- pt can be seen at next f/u- and she reminds pt to use the OTC Orajel solution for the lip/gum discomfort  I relayed this info to Mom - she is using the Orajel gel & mouthwash She agrees with the plan of care & is reminded to call for any questions or concerns

## 2019-12-11 ENCOUNTER — Encounter: Payer: Self-pay | Admitting: Physical Medicine & Rehabilitation

## 2019-12-11 ENCOUNTER — Encounter
Payer: BC Managed Care – PPO | Attending: Physical Medicine & Rehabilitation | Admitting: Physical Medicine & Rehabilitation

## 2019-12-11 VITALS — BP 110/73 | HR 84 | Temp 98.2°F | Ht 69.0 in | Wt 141.8 lb

## 2019-12-11 DIAGNOSIS — S062X9S Diffuse traumatic brain injury with loss of consciousness of unspecified duration, sequela: Secondary | ICD-10-CM | POA: Diagnosis not present

## 2019-12-11 DIAGNOSIS — R4689 Other symptoms and signs involving appearance and behavior: Secondary | ICD-10-CM | POA: Diagnosis not present

## 2019-12-11 DIAGNOSIS — S069X0S Unspecified intracranial injury without loss of consciousness, sequela: Secondary | ICD-10-CM

## 2019-12-11 MED ORDER — AMPHETAMINE-DEXTROAMPHETAMINE 30 MG PO TABS: 30.0000 mg | ORAL_TABLET | Freq: Two times a day (BID) | ORAL | 0 refills | Status: DC | PRN

## 2019-12-11 NOTE — Patient Instructions (Signed)
COVID-19 Vaccine Information can be found at: [email or web address] For questions related to vaccine distribution or appointments, please email [email or web address] or call [phone].    
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
## 2019-12-12 ENCOUNTER — Ambulatory Visit: Payer: BLUE CROSS/BLUE SHIELD | Admitting: Speech Pathology

## 2019-12-12 ENCOUNTER — Other Ambulatory Visit: Payer: Self-pay

## 2019-12-12 ENCOUNTER — Ambulatory Visit: Payer: BLUE CROSS/BLUE SHIELD

## 2019-12-12 DIAGNOSIS — R2681 Unsteadiness on feet: Secondary | ICD-10-CM

## 2019-12-12 DIAGNOSIS — R41841 Cognitive communication deficit: Secondary | ICD-10-CM

## 2019-12-12 DIAGNOSIS — R42 Dizziness and giddiness: Secondary | ICD-10-CM

## 2019-12-12 DIAGNOSIS — R2689 Other abnormalities of gait and mobility: Secondary | ICD-10-CM | POA: Diagnosis not present

## 2019-12-12 NOTE — Patient Instructions (Signed)
Access Code: [email or web address]  URL: [email or web address]  Date: 12/12/2019  Prepared by: Zerita Boers   Exercises Half Tandem Stance on Foam Pad - 3 reps - 1 sets - 10 hold - 1x daily - 6x weekly Single Leg Stance on Foam Pad - 3 reps - 1 sets - 10 hold - 1x daily - 6x weekly Side Stepping with Resistance at Thighs - 6 reps - 1 sets - 1x daily - 3x weekly
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
## 2019-12-16 ENCOUNTER — Ambulatory Visit: Payer: BLUE CROSS/BLUE SHIELD

## 2019-12-16 ENCOUNTER — Other Ambulatory Visit: Payer: Self-pay

## 2019-12-16 DIAGNOSIS — R2689 Other abnormalities of gait and mobility: Secondary | ICD-10-CM | POA: Diagnosis not present

## 2019-12-16 DIAGNOSIS — R41841 Cognitive communication deficit: Secondary | ICD-10-CM
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
## 2019-12-17 ENCOUNTER — Encounter: Payer: Self-pay | Admitting: Vascular Surgery

## 2019-12-17 ENCOUNTER — Ambulatory Visit (INDEPENDENT_AMBULATORY_CARE_PROVIDER_SITE_OTHER): Payer: BC Managed Care – PPO | Admitting: Vascular Surgery

## 2019-12-17 DIAGNOSIS — S15009A Unspecified injury of unspecified carotid artery, initial encounter: Secondary | ICD-10-CM | POA: Insufficient documentation

## 2019-12-17 DIAGNOSIS — S15009D Unspecified injury of unspecified carotid artery, subsequent encounter: Secondary | ICD-10-CM | POA: Diagnosis not present
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
## 2019-12-19 ENCOUNTER — Ambulatory Visit: Payer: BLUE CROSS/BLUE SHIELD | Admitting: Speech Pathology

## 2019-12-19 ENCOUNTER — Telehealth: Payer: Self-pay | Admitting: *Deleted

## 2019-12-19 ENCOUNTER — Ambulatory Visit: Payer: BLUE CROSS/BLUE SHIELD

## 2019-12-19 ENCOUNTER — Other Ambulatory Visit: Payer: Self-pay

## 2019-12-19 DIAGNOSIS — R2689 Other abnormalities of gait and mobility: Secondary | ICD-10-CM

## 2019-12-19 DIAGNOSIS — R42 Dizziness and giddiness: Secondary | ICD-10-CM

## 2019-12-19 DIAGNOSIS — R2681 Unsteadiness on feet: Secondary | ICD-10-CM

## 2019-12-19 DIAGNOSIS — M6281 Muscle weakness (generalized): Secondary | ICD-10-CM

## 2019-12-19 DIAGNOSIS — R41841 Cognitive communication deficit: Secondary | ICD-10-CM
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
## 2019-12-19 NOTE — Telephone Encounter (Signed)
Patients mother left a message, concerned about daughter and relocation to [place]. She would really like a call back from Dr. Riley Kill. Detria Cummings (mother) [phone]

## 2019-12-19 NOTE — Patient Instructions (Signed)
Speech Homework:  Start to do some research on brain injury. See if you can find 2-3 interesting podcasts that have to do with brain injury and download an episode of each. Listen to one podcast and take notes about what you learned/what your thoughts are about it.  Find 2 articles about TBI. Read them and take notes. Bring your notes with you next time.  Some websites to check out:   [employer] of [place]  Loveyourbrain.com (about yoga/mindfulness program for TBI). There is a video on the "about" page that might interest you.

## 2019-12-20 ENCOUNTER — Other Ambulatory Visit (HOSPITAL_COMMUNITY): Payer: Self-pay | Admitting: Plastic Surgery

## 2019-12-20 ENCOUNTER — Ambulatory Visit (INDEPENDENT_AMBULATORY_CARE_PROVIDER_SITE_OTHER): Payer: Self-pay | Admitting: Plastic Surgery

## 2019-12-20 ENCOUNTER — Ambulatory Visit (HOSPITAL_COMMUNITY)
Admission: RE | Admit: 2019-12-20 | Discharge: 2019-12-20 | Disposition: A | Discharge status: Home / Self Care | Payer: BLUE CROSS/BLUE SHIELD | Source: Ambulatory Visit | Attending: Plastic Surgery | Admitting: Plastic Surgery

## 2019-12-20 ENCOUNTER — Encounter: Payer: Self-pay | Admitting: Plastic Surgery

## 2019-12-20 VITALS — BP 122/78 | HR 83 | Temp 98.0°F | Ht 69.0 in | Wt 145.0 lb

## 2019-12-20 DIAGNOSIS — S0292XD Unspecified fracture of facial bones, subsequent encounter for fracture with routine healing: Secondary | ICD-10-CM

## 2019-12-20 DIAGNOSIS — S0292XA Unspecified fracture of facial bones, initial encounter for closed fracture: Secondary | ICD-10-CM | POA: Insufficient documentation

## 2019-12-20 DIAGNOSIS — S0292XB Unspecified fracture of facial bones, initial encounter for open fracture: Secondary | ICD-10-CM

## 2019-12-20 NOTE — Telephone Encounter (Signed)
Dr. Shellia Cleverly father, also left a message at the hospital. I reviewed concerns/questions with him and asked him to pass along the main points of our talk to Mrs Alling. thanks
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
## 2019-12-20 NOTE — Telephone Encounter (Signed)
Left mother message that Dr. Riley Kill spoke to the father.

## 2019-12-23 ENCOUNTER — Ambulatory Visit: Payer: BLUE CROSS/BLUE SHIELD | Attending: Physician Assistant | Admitting: Speech Pathology

## 2019-12-23 ENCOUNTER — Encounter (HOSPITAL_BASED_OUTPATIENT_CLINIC_OR_DEPARTMENT_OTHER): Payer: Self-pay | Admitting: Plastic Surgery

## 2019-12-23 ENCOUNTER — Other Ambulatory Visit: Payer: Self-pay

## 2019-12-23 ENCOUNTER — Encounter: Payer: Self-pay | Admitting: Speech Pathology

## 2019-12-23 DIAGNOSIS — R41841 Cognitive communication deficit: Secondary | ICD-10-CM | POA: Insufficient documentation

## 2019-12-23 NOTE — Patient Instructions (Signed)
   

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
## 2019-12-24 ENCOUNTER — Other Ambulatory Visit (HOSPITAL_COMMUNITY)
Admission: RE | Admit: 2019-12-24 | Discharge: 2019-12-24 | Disposition: A | Discharge status: Home / Self Care | Payer: BLUE CROSS/BLUE SHIELD | Source: Ambulatory Visit | Attending: Plastic Surgery | Admitting: Plastic Surgery

## 2019-12-24 DIAGNOSIS — Z20822 Contact with and (suspected) exposure to covid-19: Secondary | ICD-10-CM | POA: Diagnosis not present

## 2019-12-24 DIAGNOSIS — Z01812 Encounter for preprocedural laboratory examination: Secondary | ICD-10-CM | POA: Insufficient documentation

## 2019-12-24 LAB — SARS CORONAVIRUS 2 (TAT 6-24 HRS): SARS Coronavirus 2: NEGATIVE

## 2019-12-26 ENCOUNTER — Ambulatory Visit: Payer: BLUE CROSS/BLUE SHIELD | Admitting: Speech Pathology

## 2019-12-26 ENCOUNTER — Telehealth: Payer: Self-pay

## 2019-12-26 ENCOUNTER — Ambulatory Visit (HOSPITAL_BASED_OUTPATIENT_CLINIC_OR_DEPARTMENT_OTHER)
Admission: RE | Admit: 2019-12-26 | Payer: PRIVATE HEALTH INSURANCE | Source: Home / Self Care | Admitting: Plastic Surgery

## 2019-12-26 ENCOUNTER — Ambulatory Visit: Payer: BLUE CROSS/BLUE SHIELD

## 2019-12-26 ENCOUNTER — Ambulatory Visit: Payer: BLUE CROSS/BLUE SHIELD | Admitting: Occupational Therapy

## 2019-12-26 HISTORY — DX: Unspecified intracranial injury with loss of consciousness of unspecified duration, initial encounter: S06.9X9A

## 2019-12-26 HISTORY — DX: Unspecified intracranial injury with loss of consciousness status unknown, initial encounter: S06.9XAA

## 2019-12-26 HISTORY — DX: Personal history of (healed) traumatic fracture: Z87.81

## 2019-12-26 SURGERY — REMOVAL, HARDWARE, MANDIBLE
Anesthesia: General | Site: Mouth

## 2019-12-26 NOTE — Telephone Encounter (Signed)
Patient father Homero Fellers Skiver,MD called stating needing to speak with Dr. Riley Kill or Marissa Nestle about an Accomodation Form for work.

## 2019-12-27 NOTE — Telephone Encounter (Signed)
Father notified.

## 2019-12-27 NOTE — Telephone Encounter (Signed)
I completed this form on Wednesday, and I believe I asked April to fax it.

## 2019-12-30 ENCOUNTER — Encounter: Payer: Self-pay | Admitting: Speech Pathology

## 2019-12-30 ENCOUNTER — Other Ambulatory Visit: Payer: Self-pay

## 2019-12-30 ENCOUNTER — Ambulatory Visit: Payer: BLUE CROSS/BLUE SHIELD | Admitting: Speech Pathology

## 2019-12-30 DIAGNOSIS — R41841 Cognitive communication deficit: Secondary | ICD-10-CM | POA: Diagnosis not present

## 2019-12-30 NOTE — Patient Instructions (Signed)
   When you move back to [facility], educate your roomies that you need some quiet time and they should avoid interrupting you when you are working from home  You look really good, others may not understand if you have moments of brain fog, fatigue or headaches  When you go back to work, you may need to rest after work and avoid going out late - you may also need a little more rest on the weekends
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
## 2020-01-01 ENCOUNTER — Telehealth: Payer: Self-pay

## 2020-01-01 ENCOUNTER — Ambulatory Visit: Payer: BLUE CROSS/BLUE SHIELD | Admitting: Speech Pathology

## 2020-01-01 ENCOUNTER — Encounter (HOSPITAL_BASED_OUTPATIENT_CLINIC_OR_DEPARTMENT_OTHER): Payer: Self-pay | Admitting: Plastic Surgery

## 2020-01-01 ENCOUNTER — Other Ambulatory Visit: Payer: Self-pay

## 2020-01-01 NOTE — Telephone Encounter (Signed)
Called mother and advised I spoke with Dr. Ulice Bold. It is okay for her to continue to stay on the aspirin

## 2020-01-01 NOTE — Telephone Encounter (Signed)
Received call from patient's mother. She was called by day surgery who advised her to contact Dr. Kittie Plater office to see if Maria Daniels should discontinue the baby aspirin 5 days prior to surgery.

## 2020-01-04 ENCOUNTER — Other Ambulatory Visit (HOSPITAL_COMMUNITY)
Admission: RE | Admit: 2020-01-04 | Discharge: 2020-01-04 | Disposition: A | Discharge status: Home / Self Care | Payer: BC Managed Care – PPO | Source: Ambulatory Visit | Attending: Plastic Surgery | Admitting: Plastic Surgery

## 2020-01-04 DIAGNOSIS — Z01812 Encounter for preprocedural laboratory examination: Secondary | ICD-10-CM | POA: Diagnosis not present

## 2020-01-04 DIAGNOSIS — Z20822 Contact with and (suspected) exposure to covid-19: Secondary | ICD-10-CM | POA: Insufficient documentation

## 2020-01-04 LAB — SARS CORONAVIRUS 2 (TAT 6-24 HRS): SARS Coronavirus 2: NEGATIVE

## 2020-01-06 ENCOUNTER — Encounter: Payer: BC Managed Care – PPO | Admitting: Speech Pathology

## 2020-01-06 ENCOUNTER — Other Ambulatory Visit (HOSPITAL_COMMUNITY): Payer: PRIVATE HEALTH INSURANCE

## 2020-01-08 ENCOUNTER — Ambulatory Visit (HOSPITAL_BASED_OUTPATIENT_CLINIC_OR_DEPARTMENT_OTHER): Payer: BC Managed Care – PPO | Admitting: Anesthesiology

## 2020-01-08 ENCOUNTER — Ambulatory Visit (HOSPITAL_BASED_OUTPATIENT_CLINIC_OR_DEPARTMENT_OTHER)
Admission: RE | Admit: 2020-01-08 | Discharge: 2020-01-08 | Disposition: A | Discharge status: Home / Self Care | Payer: BC Managed Care – PPO | Attending: Plastic Surgery | Admitting: Plastic Surgery

## 2020-01-08 ENCOUNTER — Encounter: Payer: BC Managed Care – PPO | Admitting: Speech Pathology

## 2020-01-08 ENCOUNTER — Encounter (HOSPITAL_BASED_OUTPATIENT_CLINIC_OR_DEPARTMENT_OTHER): Payer: Self-pay | Admitting: Plastic Surgery

## 2020-01-08 ENCOUNTER — Other Ambulatory Visit: Payer: Self-pay

## 2020-01-08 ENCOUNTER — Encounter (HOSPITAL_BASED_OUTPATIENT_CLINIC_OR_DEPARTMENT_OTHER)
Admission: RE | Disposition: A | Discharge status: Home / Self Care | Payer: Self-pay | Source: Home / Self Care | Attending: Plastic Surgery

## 2020-01-08 DIAGNOSIS — Z87828 Personal history of other (healed) physical injury and trauma: Secondary | ICD-10-CM | POA: Diagnosis not present

## 2020-01-08 DIAGNOSIS — Z4589 Encounter for adjustment and management of other implanted devices: Secondary | ICD-10-CM | POA: Diagnosis not present

## 2020-01-08 DIAGNOSIS — S02401D Maxillary fracture, unspecified, subsequent encounter for fracture with routine healing: Secondary | ICD-10-CM | POA: Diagnosis not present

## 2020-01-08 DIAGNOSIS — S0292XD Unspecified fracture of facial bones, subsequent encounter for fracture with routine healing: Secondary | ICD-10-CM

## 2020-01-08 HISTORY — PX: MANDIBULAR HARDWARE REMOVAL: SHX5205

## 2020-01-08 LAB — POCT PREGNANCY, URINE: Preg Test, Ur: NEGATIVE

## 2020-01-08 SURGERY — REMOVAL, HARDWARE, MANDIBLE
Anesthesia: Monitor Anesthesia Care | Site: Mouth | Laterality: Bilateral

## 2020-01-08 MED ORDER — PROPOFOL 500 MG/50ML IV EMUL
INTRAVENOUS | Status: DC | PRN
  Administered 2020-01-08: 100 ug/kg/min via INTRAVENOUS

## 2020-01-08 MED ORDER — FENTANYL CITRATE (PF) 100 MCG/2ML IJ SOLN
INTRAMUSCULAR | Status: DC | PRN
  Administered 2020-01-08: 50 ug via INTRAVENOUS

## 2020-01-08 MED ORDER — AMOXICILLIN-POT CLAVULANATE 500-125 MG PO TABS: 1.0000 | ORAL_TABLET | Freq: Three times a day (TID) | ORAL | 0 refills | Status: AC

## 2020-01-08 MED ORDER — LIDOCAINE-EPINEPHRINE 1 %-1:100000 IJ SOLN
INTRAMUSCULAR | Status: AC
  Filled 2020-01-08: qty 3

## 2020-01-08 MED ORDER — LIDOCAINE 2% (20 MG/ML) 5 ML SYRINGE
INTRAMUSCULAR | Status: DC | PRN
  Administered 2020-01-08: 40 mg via INTRAVENOUS

## 2020-01-08 MED ORDER — OXYCODONE HCL 5 MG PO TABS
5.0000 mg | ORAL_TABLET | ORAL | Status: DC | PRN
  Administered 2020-01-08: 5 mg via ORAL

## 2020-01-08 MED ORDER — MIDAZOLAM HCL 5 MG/5ML IJ SOLN
INTRAMUSCULAR | Status: DC | PRN
  Administered 2020-01-08: 2 mg via INTRAVENOUS

## 2020-01-08 MED ORDER — LIDOCAINE 2% (20 MG/ML) 5 ML SYRINGE
INTRAMUSCULAR | Status: AC
  Filled 2020-01-08: qty 5

## 2020-01-08 MED ORDER — SUCCINYLCHOLINE CHLORIDE 200 MG/10ML IV SOSY
PREFILLED_SYRINGE | INTRAVENOUS | Status: AC
  Filled 2020-01-08: qty 10

## 2020-01-08 MED ORDER — LIDOCAINE-EPINEPHRINE 1 %-1:100000 IJ SOLN
INTRAMUSCULAR | Status: DC | PRN
  Administered 2020-01-08: 3 mL

## 2020-01-08 MED ORDER — SODIUM CHLORIDE 0.9 % IV SOLN: 250.0000 mL | INTRAVENOUS | Status: DC | PRN

## 2020-01-08 MED ORDER — SODIUM CHLORIDE 0.9% FLUSH: 3.0000 mL | Freq: Two times a day (BID) | INTRAVENOUS | Status: DC

## 2020-01-08 MED ORDER — FENTANYL CITRATE (PF) 100 MCG/2ML IJ SOLN: 25.0000 ug | INTRAMUSCULAR | Status: DC | PRN

## 2020-01-08 MED ORDER — DEXAMETHASONE SODIUM PHOSPHATE 10 MG/ML IJ SOLN
INTRAMUSCULAR | Status: AC
  Filled 2020-01-08: qty 1

## 2020-01-08 MED ORDER — HYDROCODONE-ACETAMINOPHEN 5-325 MG PO TABS: 1.0000 | ORAL_TABLET | Freq: Three times a day (TID) | ORAL | 0 refills | Status: AC | PRN

## 2020-01-08 MED ORDER — CEFAZOLIN SODIUM-DEXTROSE 2-4 GM/100ML-% IV SOLN
INTRAVENOUS | Status: AC
  Filled 2020-01-08: qty 100

## 2020-01-08 MED ORDER — LACTATED RINGERS IV SOLN: INTRAVENOUS | Status: DC | PRN

## 2020-01-08 MED ORDER — ONDANSETRON HCL 4 MG/2ML IJ SOLN
INTRAMUSCULAR | Status: DC | PRN
  Administered 2020-01-08: 4 mg via INTRAVENOUS

## 2020-01-08 MED ORDER — SODIUM CHLORIDE 0.9% FLUSH: 3.0000 mL | INTRAVENOUS | Status: DC | PRN

## 2020-01-08 MED ORDER — BUPIVACAINE HCL (PF) 0.25 % IJ SOLN
INTRAMUSCULAR | Status: AC
  Filled 2020-01-08: qty 120

## 2020-01-08 MED ORDER — ACETAMINOPHEN 500 MG PO TABS: 1000.0000 mg | ORAL_TABLET | Freq: Once | ORAL | Status: DC

## 2020-01-08 MED ORDER — MIDAZOLAM HCL 2 MG/2ML IJ SOLN
INTRAMUSCULAR | Status: AC
  Filled 2020-01-08: qty 2

## 2020-01-08 MED ORDER — ACETAMINOPHEN 650 MG RE SUPP: 650.0000 mg | RECTAL | Status: DC | PRN

## 2020-01-08 MED ORDER — OXYCODONE HCL 5 MG PO TABS
ORAL_TABLET | ORAL | Status: AC
  Filled 2020-01-08: qty 1

## 2020-01-08 MED ORDER — LIDOCAINE HCL (PF) 1 % IJ SOLN
INTRAMUSCULAR | Status: AC
  Filled 2020-01-08: qty 60

## 2020-01-08 MED ORDER — EPINEPHRINE PF 1 MG/ML IJ SOLN
INTRAMUSCULAR | Status: AC
  Filled 2020-01-08: qty 2

## 2020-01-08 MED ORDER — ONDANSETRON HCL 4 MG/2ML IJ SOLN
INTRAMUSCULAR | Status: AC
  Filled 2020-01-08: qty 2

## 2020-01-08 MED ORDER — EPHEDRINE 5 MG/ML INJ
INTRAVENOUS | Status: AC
  Filled 2020-01-08: qty 10

## 2020-01-08 MED ORDER — ACETAMINOPHEN 325 MG PO TABS: 650.0000 mg | ORAL_TABLET | ORAL | Status: DC | PRN

## 2020-01-08 MED ORDER — SODIUM CHLORIDE (PF) 0.9 % IJ SOLN
INTRAMUSCULAR | Status: AC
  Filled 2020-01-08: qty 20

## 2020-01-08 MED ORDER — FENTANYL CITRATE (PF) 100 MCG/2ML IJ SOLN
INTRAMUSCULAR | Status: AC
  Filled 2020-01-08: qty 2

## 2020-01-08 MED ORDER — ONDANSETRON HCL 4 MG PO TABS: 4.0000 mg | ORAL_TABLET | Freq: Three times a day (TID) | ORAL | 0 refills | Status: AC | PRN

## 2020-01-08 MED ORDER — PHENYLEPHRINE 40 MCG/ML (10ML) SYRINGE FOR IV PUSH (FOR BLOOD PRESSURE SUPPORT)
PREFILLED_SYRINGE | INTRAVENOUS | Status: AC
  Filled 2020-01-08: qty 10

## 2020-01-08 SURGICAL SUPPLY — 32 items
BLADE SURG 15 STRL LF DISP TIS (BLADE) ×1 IMPLANT
BLADE SURG 15 STRL SS (BLADE) ×1
CANISTER SUCT 1200ML W/VALVE (MISCELLANEOUS) ×2 IMPLANT
COVER MAYO STAND STRL (DRAPES) ×2 IMPLANT
COVER WAND RF STERILE (DRAPES) IMPLANT
DECANTER SPIKE VIAL GLASS SM (MISCELLANEOUS) IMPLANT
DERMABOND ADVANCED (GAUZE/BANDAGES/DRESSINGS)
DERMABOND ADVANCED .7 DNX12 (GAUZE/BANDAGES/DRESSINGS) IMPLANT
ELECT COATED BLADE 2.86 ST (ELECTRODE) IMPLANT
ELECT REM PT RETURN 9FT ADLT (ELECTROSURGICAL) ×2
ELECTRODE REM PT RTRN 9FT ADLT (ELECTROSURGICAL) ×1 IMPLANT
GAUZE SPONGE 4X4 12PLY STRL LF (GAUZE/BANDAGES/DRESSINGS) IMPLANT
GLOVE BIO SURGEON STRL SZ 6.5 (GLOVE) ×4 IMPLANT
GLOVE BIO SURGEON STRL SZ7 (GLOVE) ×2 IMPLANT
GOWN STRL REUS W/ TWL LRG LVL3 (GOWN DISPOSABLE) ×2 IMPLANT
GOWN STRL REUS W/ TWL XL LVL3 (GOWN DISPOSABLE) ×1 IMPLANT
GOWN STRL REUS W/TWL LRG LVL3 (GOWN DISPOSABLE) ×2
GOWN STRL REUS W/TWL XL LVL3 (GOWN DISPOSABLE) ×1
MARKER SKIN DUAL TIP RULER LAB (MISCELLANEOUS) IMPLANT
NEEDLE PRECISIONGLIDE 27X1.5 (NEEDLE) ×2 IMPLANT
PACK BASIN DAY SURGERY FS (CUSTOM PROCEDURE TRAY) ×2 IMPLANT
PENCIL FOOT CONTROL (ELECTRODE) IMPLANT
SCISSORS WIRE ANG 4 3/4 DISP (INSTRUMENTS) IMPLANT
SHEET MEDIUM DRAPE 40X70 STRL (DRAPES) ×2 IMPLANT
SUT CHROMIC 3 0 PS 2 (SUTURE) IMPLANT
SUT CHROMIC 4 0 PS 2 18 (SUTURE) IMPLANT
SUT VIC AB 5-0 PS2 18 (SUTURE) ×2 IMPLANT
SYR CONTROL 10ML LL (SYRINGE) ×2 IMPLANT
TOWEL GREEN STERILE FF (TOWEL DISPOSABLE) ×2 IMPLANT
TRAY DSU PREP LF (CUSTOM PROCEDURE TRAY) IMPLANT
TUBE CONNECTING 20X1/4 (TUBING) ×2 IMPLANT
YANKAUER SUCT BULB TIP NO VENT (SUCTIONS) ×2 IMPLANT

## 2020-01-08 NOTE — Discharge Instructions (Addendum)
No Tylenol before 12:15pm today.  INSTRUCTIONS FOR AFTER SURGERY   You will likely have some questions about what to expect following your operation.  The following information will help you and your family understand what to expect when you are discharged from the hospital.  Following these guidelines will help ensure a smooth recovery and reduce risks of complications.  Postoperative instructions include information on: diet, wound care, medications and physical activity.  AFTER SURGERY Expect to go home after the procedure.   DIET Liquid to soft diet No chewing for two weeks  WOUND CARE You may shower today.   Brush your teeth with a very soft tooth brush Swish and spit with diluted mouthwash or warm salt water three times a day.  ACTIVITY No heavy lifting until cleared by the doctor.  It is OK to walk and climb stairs. In fact, moving your legs is very important to decrease your risk of a blood clot.  It will also help keep you from getting deconditioned.  Every 1 to 2 hours get up and walk for 5 minutes. This will help with a quicker recovery back to normal.  Let pain be your guide so you don't do too much. This is your time for TLC.   WORK Everyone returns to work at different times. As a rough guide, most people take at least 1 week off prior to returning to work. If you need documentation for your job, bring the forms to your postoperative follow up visit.  DRIVING Arrange for someone to bring you home from the hospital.  You may be able to drive a few days after surgery but not while taking any narcotics or valium.  BOWEL MOVEMENTS Constipation can occur after anesthesia and while taking pain medication.  It is important to stay ahead for your comfort.  We recommend taking Milk of Magnesia (2 tablespoons; twice a day) while taking the pain pills.  MEDICATIONS and PAIN CONTROL At your preoperative visit for you history and physical you were given the following  medications: 1. An antibiotic: Start this medication when you get home and take according to the instructions on the bottle. 2. Zofran 4 mg:  This is to treat nausea and vomiting.  You can take this every 6 hours as needed and only if needed. 3. Norco (hydrocodone/acetaminophen) 5/325 mg:  This is only to be used after you have taken the motrin or the tylenol. Every 8 hours as needed. Over the counter Medication to take: 4. Ibuprofen (Motrin) 600 mg:  Take this every 6 hours.  If you have additional pain then take 500 mg of the tylenol.  Only take the Norco after you have tried these two. 5. Miralax or stool softener of choice: Take this according to the bottle if you take the Norco.  WHEN TO CALL Call your surgeon's office if any of the following occur: . Fever 101 degrees F or greater . Excessive bleeding or fluid from the incision site. . Pain that increases over time without aid from the medications . Redness, warmth, or pus draining from incision sites . Persistent nausea or inability to take in liquids . Severe misshapen area that underwent the operation.   Post Anesthesia Home Care Instructions  Activity: Get plenty of rest for the remainder of the day. A responsible individual must stay with you for 24 hours following the procedure.  For the next 24 hours, DO NOT: -Drive a car -Advertising copywriter -Drink alcoholic beverages -Take any medication unless instructed  by your physician -Make any legal decisions or sign important papers.  Meals: Start with liquid foods such as gelatin or soup. Progress to regular foods as tolerated. Avoid greasy, spicy, heavy foods. If nausea and/or vomiting occur, drink only clear liquids until the nausea and/or vomiting subsides. Call your physician if vomiting continues.  Special Instructions/Symptoms: Your throat may feel dry or sore from the anesthesia or the breathing tube placed in your throat during surgery. If this causes discomfort, gargle  with warm salt water. The discomfort should disappear within 24 hours.  If you had a scopolamine patch placed behind your ear for the management of post- operative nausea and/or vomiting:  1. The medication in the patch is effective for 72 hours, after which it should be removed.  Wrap patch in a tissue and discard in the trash. Wash hands thoroughly with soap and water. 2. You may remove the patch earlier than 72 hours if you experience unpleasant side effects which may include dry mouth, dizziness or visual disturbances. 3. Avoid touching the patch. Wash your hands with soap and water after contact with the patch.
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
## 2020-01-08 NOTE — Anesthesia Postprocedure Evaluation (Signed)
Anesthesia Post Note  Patient: Maria Daniels  Procedure(s) Performed: Removal of maxillomandibular fixation (Bilateral Mouth)     Patient location during evaluation: PACU Anesthesia Type: MAC Level of consciousness: awake and alert Pain management: pain level controlled Vital Signs Assessment: post-procedure vital signs reviewed and stable Respiratory status: spontaneous breathing, nonlabored ventilation, respiratory function stable and patient connected to nasal cannula oxygen Cardiovascular status: stable and blood pressure returned to baseline Postop Assessment: no apparent nausea or vomiting Anesthetic complications: no    Last Vitals:  Vitals:   01/08/20 0915 01/08/20 0930  BP: 103/66   Pulse: (!) 51 (!) 55  Resp: 12 17  Temp:    SpO2: 100% 100%    Last Pain:  Vitals:   01/08/20 0907  TempSrc:   PainSc: 0-No pain                 Maria Daniels

## 2020-01-08 NOTE — Transfer of Care (Signed)
Immediate Anesthesia Transfer of Care Note  Patient: Maria Daniels  Procedure(s) Performed: Removal of maxillomandibular fixation (Bilateral Mouth)  Patient Location: PACU  Anesthesia Type:MAC  Level of Consciousness: awake, alert  and oriented  Airway & Oxygen Therapy: Patient Spontanous Breathing and Patient connected to face mask oxygen  Post-op Assessment: Report given to RN and Post -op Vital signs reviewed and stable  Post vital signs: Reviewed and stable  Last Vitals:  Vitals Value Taken Time  BP 97/54 01/08/20 0900  Temp    Pulse 58 01/08/20 0905  Resp 16 01/08/20 0905  SpO2 100 % 01/08/20 0905  Vitals shown include unvalidated device data.  Last Pain:  Vitals:   01/08/20 0733  TempSrc: Tympanic  PainSc: 0-No pain      Patients Stated Pain Goal: 4 (80/16/55 3748)  Complications: No apparent anesthesia complications
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
## 2020-01-08 NOTE — Interval H&P Note (Signed)
History and Physical Interval Note:  01/08/2020 7:59 AM  Maria Daniels  has presented today for surgery, with the diagnosis of multiple facial fractures; Closed Extensive Facial Fractures With Routine Healing.  The various methods of treatment have been discussed with the patient and family. After consideration of risks, benefits and other options for treatment, the patient has consented to  Procedure(s) with comments: Removal of maxillomandibular fixation (N/A) - 45 min total as a surgical intervention.  The patient's history has been reviewed, patient examined, no change in status, stable for surgery.  I have reviewed the patient's chart and labs.  Questions were answered to the patient's satisfaction.     Alena Bills Caci Orren

## 2020-01-09 ENCOUNTER — Encounter: Payer: Self-pay | Admitting: *Deleted

## 2020-01-09 ENCOUNTER — Other Ambulatory Visit: Payer: Self-pay | Admitting: Orthopedic Surgery

## 2020-01-09 ENCOUNTER — Ambulatory Visit: Payer: BLUE CROSS/BLUE SHIELD | Admitting: Occupational Therapy

## 2020-01-10 ENCOUNTER — Telehealth: Payer: Self-pay

## 2020-01-10 ENCOUNTER — Encounter: Payer: PRIVATE HEALTH INSURANCE | Admitting: Plastic Surgery

## 2020-01-10 NOTE — Telephone Encounter (Signed)
Pt's mother called with questions regarding pt restarting her birth control pills which were d/c during her hospital stay. Pt's mother stated a PA in another office had made that decision to stop them and she is going to try to call that office to get clarity and will call us back if she still has questions.

## 2020-01-13 ENCOUNTER — Encounter: Payer: BC Managed Care – PPO | Admitting: Speech Pathology

## 2020-01-14 ENCOUNTER — Telehealth: Payer: Self-pay

## 2020-01-14 NOTE — Telephone Encounter (Signed)
Per Dr. Chestine Spore, pt is fine to restart her birth control. She is to stay on Aspirin. Called pt and spoke to her mother, who initially called [place] the other day regarding pt resuming birth control pills. Her mother is aware of what Dr. Chestine Spore said. No further questions/concerns at this time.

## 2020-01-15 ENCOUNTER — Encounter
Payer: BC Managed Care – PPO | Attending: Physical Medicine & Rehabilitation | Admitting: Physical Medicine & Rehabilitation

## 2020-01-15 ENCOUNTER — Encounter: Payer: BC Managed Care – PPO | Admitting: Speech Pathology

## 2020-01-15 ENCOUNTER — Encounter: Payer: Self-pay | Admitting: Physical Medicine & Rehabilitation

## 2020-01-15 ENCOUNTER — Other Ambulatory Visit: Payer: Self-pay

## 2020-01-15 VITALS — BP 108/72 | HR 78 | Temp 97.5°F | Ht 69.25 in | Wt 140.0 lb

## 2020-01-15 DIAGNOSIS — S062X9S Diffuse traumatic brain injury with loss of consciousness of unspecified duration, sequela: Secondary | ICD-10-CM | POA: Insufficient documentation

## 2020-01-15 DIAGNOSIS — S069X0S Unspecified intracranial injury without loss of consciousness, sequela: Secondary | ICD-10-CM | POA: Diagnosis present

## 2020-01-15 DIAGNOSIS — R4689 Other symptoms and signs involving appearance and behavior: Secondary | ICD-10-CM | POA: Diagnosis present

## 2020-01-15 NOTE — Patient Instructions (Signed)
PLEASE FEEL FREE TO CALL OUR OFFICE WITH ANY PROBLEMS OR QUESTIONS ([phone])      
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
## 2020-01-16 ENCOUNTER — Encounter: Payer: BC Managed Care – PPO | Admitting: Speech Pathology

## 2020-01-17 ENCOUNTER — Ambulatory Visit (INDEPENDENT_AMBULATORY_CARE_PROVIDER_SITE_OTHER): Payer: PRIVATE HEALTH INSURANCE | Admitting: Plastic Surgery

## 2020-01-17 ENCOUNTER — Telehealth: Payer: Self-pay | Admitting: Plastic Surgery

## 2020-01-17 ENCOUNTER — Encounter: Payer: PRIVATE HEALTH INSURANCE | Admitting: Plastic Surgery

## 2020-01-17 ENCOUNTER — Encounter: Payer: Self-pay | Admitting: Plastic Surgery

## 2020-01-17 ENCOUNTER — Other Ambulatory Visit: Payer: Self-pay

## 2020-01-17 DIAGNOSIS — S0292XD Unspecified fracture of facial bones, subsequent encounter for fracture with routine healing: Secondary | ICD-10-CM

## 2020-01-17 DIAGNOSIS — Z719 Counseling, unspecified: Secondary | ICD-10-CM

## 2020-01-17 NOTE — Telephone Encounter (Signed)
Spoke with her mom. Also answered the questions via MyChart.

## 2020-01-17 NOTE — Progress Notes (Signed)
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 I discussed with the patient this proposed procedure of botulinum toxin injections, which is customized depending on the particular needs of the patient. It is performed on facial rhytids as a temporary correction. The risks were addressed including asymmetry. The individual's choice to undergo a surgical procedure is based on the comparison of risks to potential benefits. Other risks include unsatisfactory results, brow ptosis, eyelid ptosis, allergic reaction, temporary paralysis, which should go away with time. Botulinum toxin injections do not arrest the aging process or produce permanent tightening of the eyelid.  Operative intervention maybe necessary to maintain the results of a blepharoplasty or botulinum toxin. The patient understands and wishes to proceed.  Procedure: The area was prepped with alcohol and dried with a clean gauze. Using a clean technique, the botulinum toxin was diluted with 1.25 cc of preservative-free normal saline which was slowly injected with an 18 gauge needle in a tuberculin syringes.  A 32 gauge needles were then used to inject the botulinum toxin. This mixture allow for an aliquot of 5 units per 0.1 cc in each injection site.    The mixture was injected in the glabellar and forehead area with preservation of the temporal branch.  A total of 10 Units of botulinum toxin was used. The forehead and glabellar area was injected with care to inject intramuscular only while holding pressure on the supratrochlear vessels in each area during each injection on either side of the medial corrugators.  No complications were noted. Light pressure was held. She was instructed explicitly in post-operative  care.

## 2020-01-17 NOTE — Telephone Encounter (Signed)
Mom, Almira Coaster, called to ask a couple of questions about the stitches in her mouth. She said they forgot to ask about them during her visit. Please call her back.

## 2020-01-17 NOTE — Progress Notes (Signed)
The patient is a 29 yrs old wf here with mom for follow up on her facial fractures.  She had her maxillomandibular fixation removed 2 weeks ago.  She is feeling much better.  She is starting to notice benefit.  She does not have any persistent face.  She is still concerned about the lack of movement of her frontalis on the right side.  She notices that the left side is very expressive.  She knew beforehand that the left side was more expressive to begin with.  She is a candidate for calming the left frontalis down with some Botox.  She is going to the dentist in 2 weeks in [place].  She will be home in the next few months.

## 2020-01-20 ENCOUNTER — Encounter: Payer: BC Managed Care – PPO | Admitting: Speech Pathology

## 2020-01-22 ENCOUNTER — Encounter: Payer: BC Managed Care – PPO | Admitting: Speech Pathology

## 2020-01-27 ENCOUNTER — Encounter: Payer: BC Managed Care – PPO | Admitting: Speech Pathology

## 2020-01-29 ENCOUNTER — Encounter: Payer: BC Managed Care – PPO | Admitting: Speech Pathology

## 2020-03-24 ENCOUNTER — Other Ambulatory Visit: Payer: Self-pay

## 2020-03-24 DIAGNOSIS — S15009D Unspecified injury of unspecified carotid artery, subsequent encounter: Secondary | ICD-10-CM

## 2020-05-08 ENCOUNTER — Encounter: Payer: Self-pay | Admitting: Plastic Surgery

## 2020-05-20 ENCOUNTER — Other Ambulatory Visit: Payer: Self-pay

## 2020-05-20 ENCOUNTER — Encounter: Payer: Self-pay | Admitting: Physical Medicine & Rehabilitation

## 2020-05-20 ENCOUNTER — Encounter
Payer: BC Managed Care – PPO | Attending: Physical Medicine & Rehabilitation | Admitting: Physical Medicine & Rehabilitation

## 2020-05-20 VITALS — BP 121/75 | HR 91 | Ht 69.0 in | Wt 139.0 lb

## 2020-05-20 DIAGNOSIS — S062X9S Diffuse traumatic brain injury with loss of consciousness of unspecified duration, sequela: Secondary | ICD-10-CM | POA: Diagnosis not present

## 2020-05-20 NOTE — Patient Instructions (Signed)
PLEASE FEEL FREE TO CALL OUR OFFICE WITH ANY PROBLEMS OR QUESTIONS ([phone])      
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
## 2020-05-21 ENCOUNTER — Other Ambulatory Visit: Payer: BC Managed Care – PPO

## 2020-05-22 ENCOUNTER — Encounter: Payer: Self-pay | Admitting: Plastic Surgery

## 2020-05-22 ENCOUNTER — Ambulatory Visit (INDEPENDENT_AMBULATORY_CARE_PROVIDER_SITE_OTHER): Payer: BC Managed Care – PPO | Admitting: Plastic Surgery

## 2020-05-22 ENCOUNTER — Other Ambulatory Visit: Payer: Self-pay

## 2020-05-22 DIAGNOSIS — S0292XD Unspecified fracture of facial bones, subsequent encounter for fracture with routine healing: Secondary | ICD-10-CM
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
## 2020-05-26 ENCOUNTER — Ambulatory Visit: Payer: BC Managed Care – PPO | Admitting: Vascular Surgery

## 2020-06-09 ENCOUNTER — Ambulatory Visit: Payer: BLUE CROSS/BLUE SHIELD | Admitting: Vascular Surgery

## 2020-10-22 IMAGING — CT CT HEAD W/O CM
4 series · 16 of 47 positions shown, 18 images · non-contrast
Comparison: Three days ago

CLINICAL DATA: Stable traumatic brain injury

EXAM:
CT HEAD WITHOUT CONTRAST
TECHNIQUE: Contiguous axial images were obtained from the base of the skull
through the vertex without intravenous contrast.

[Series 3: head wo · axial · 0.46mm/px · z∈[-166,-42]mm · 7 of 35 slices shown, 9 images]
[im 5/35  brain]
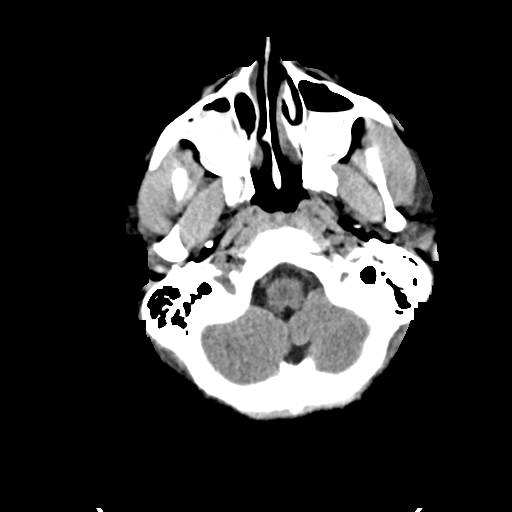
[im 5/35  bone]
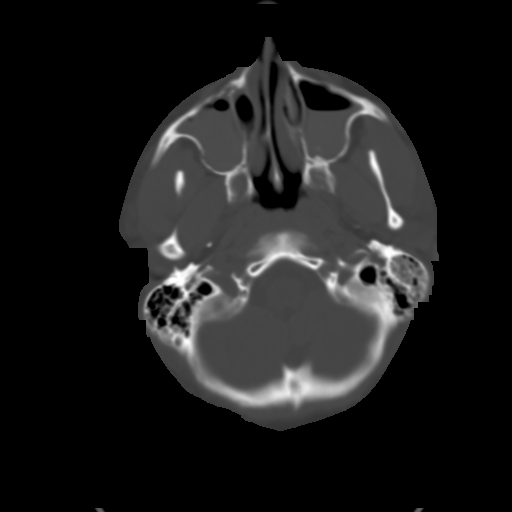
[im 9/35  brain]
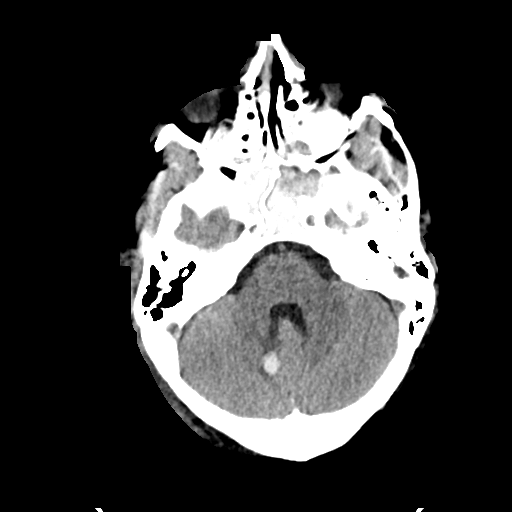
[im 13/35  brain]
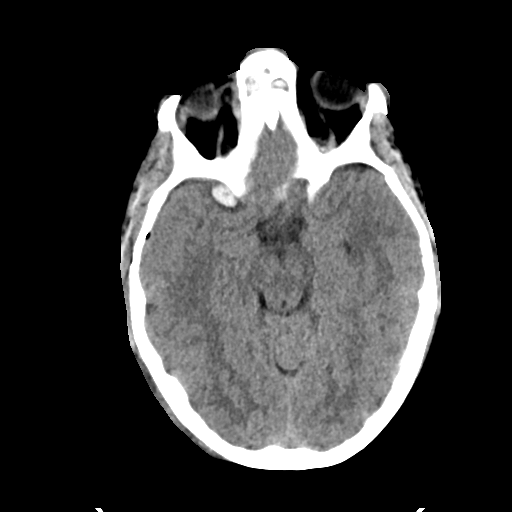
[im 18/35  brain]
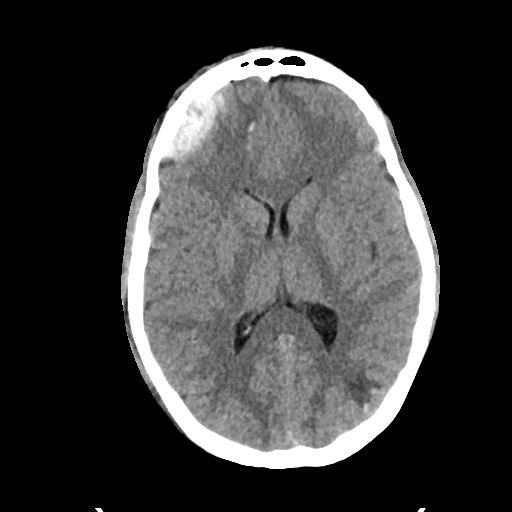
[im 22/35  brain]
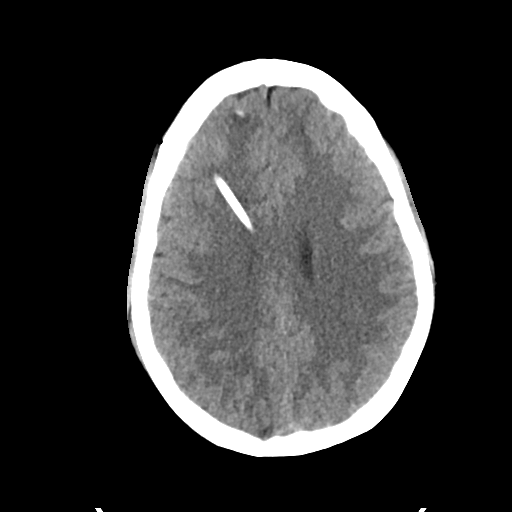
[im 22/35  bone]
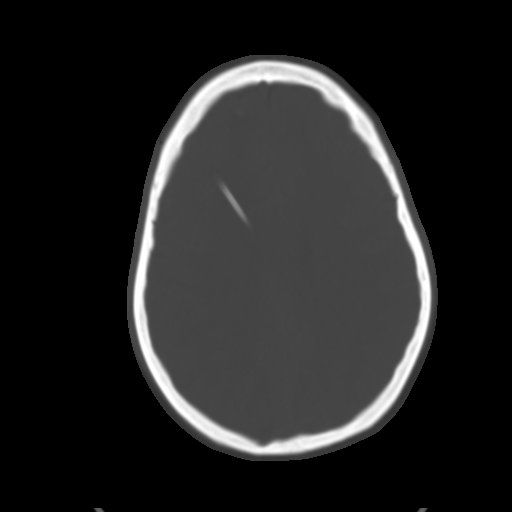
[im 26/35  brain]
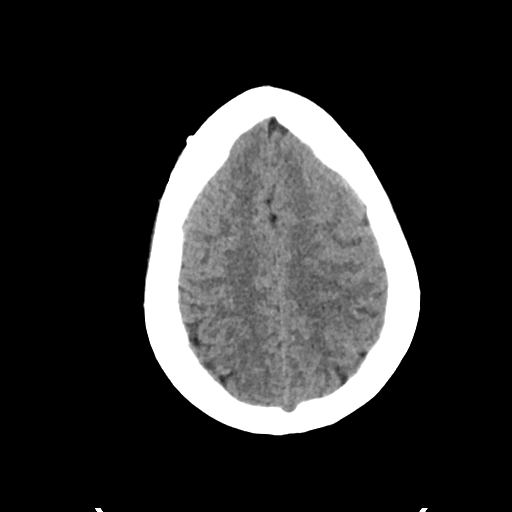
[im 30/35  brain]
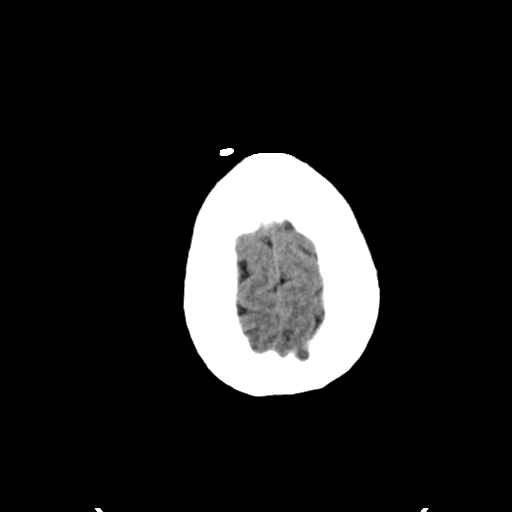

[Series 4: head bone · axial · 0.46mm/px · z∈[-170,-134]mm · 3 of 88 slices shown]
[im 9/88  bone]
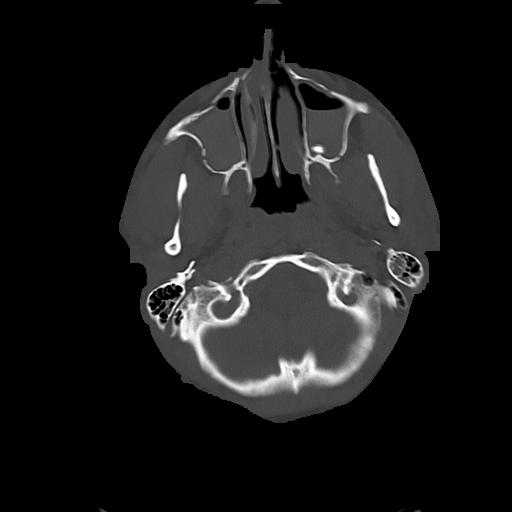
[im 18/88  bone]
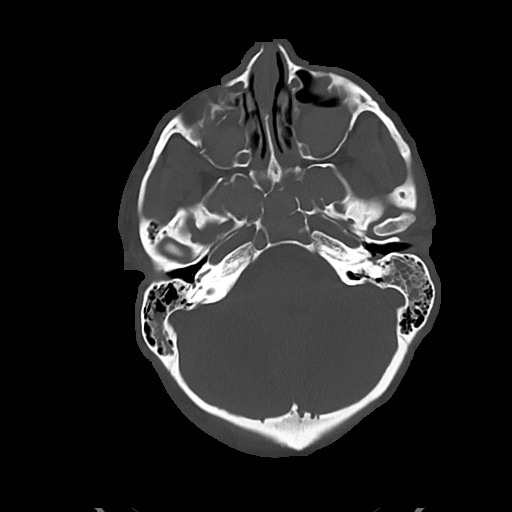
[im 27/88  bone]
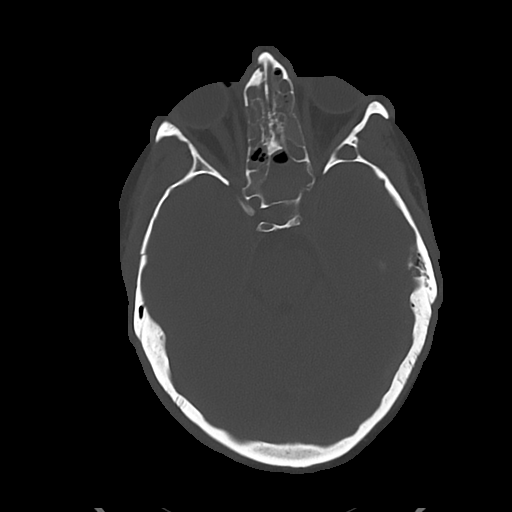

[Series 5: cor soft · coronal · 0.36mm/px · 3 of 76 slices shown]
[im 26/76  brain]
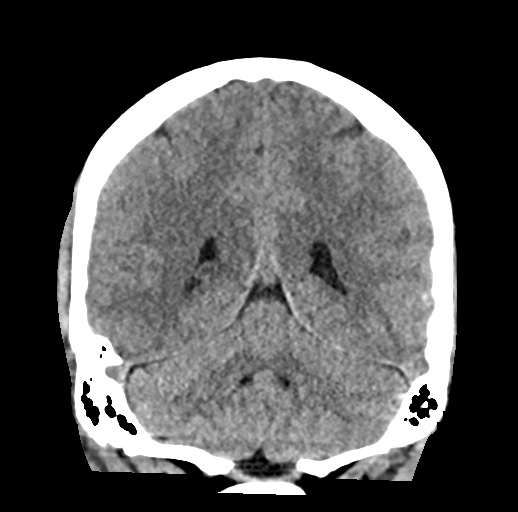
[im 34/76  brain]
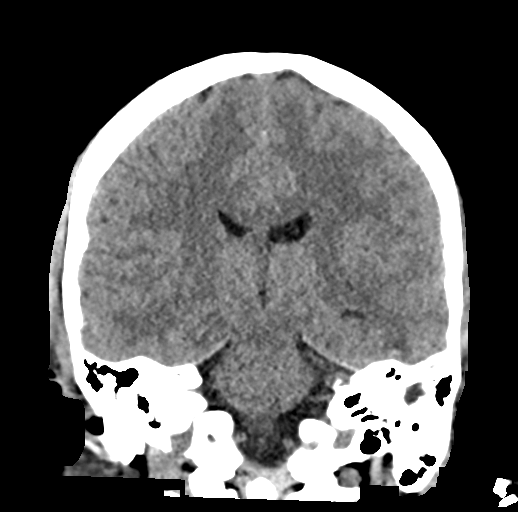
[im 42/76  brain]
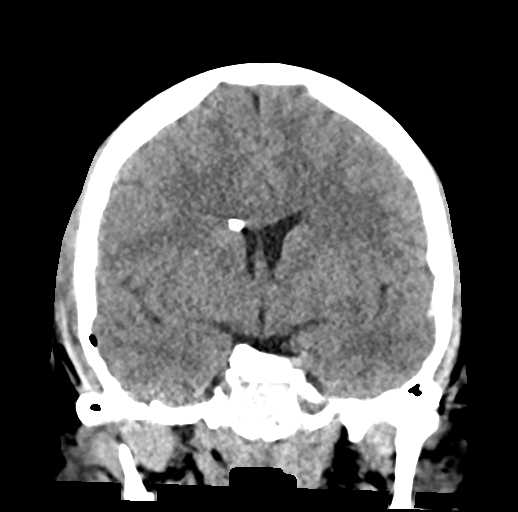

[Series 6: sag soft · sagittal · 0.37mm/px · 3 of 62 slices shown]
[im 21/62  brain]
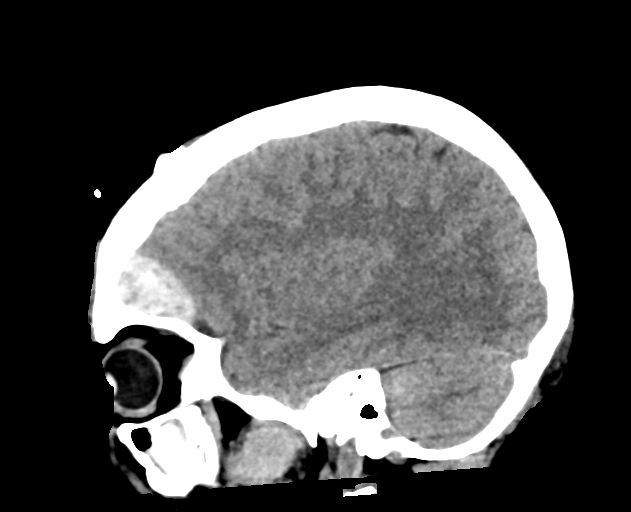
[im 31/62  brain]
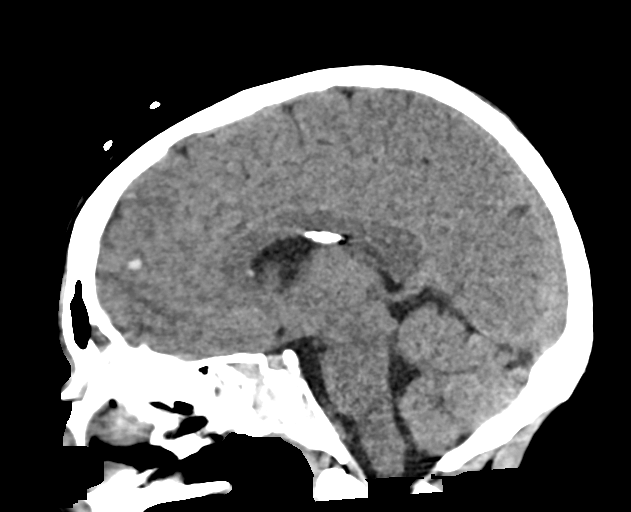
[im 41/62  brain]
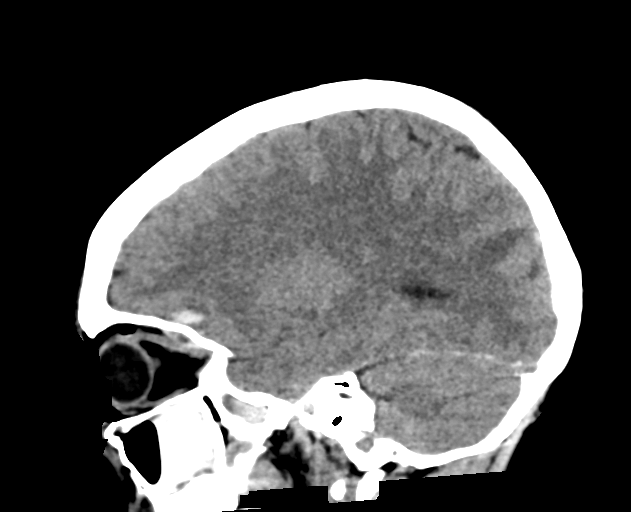

[16 of 47 positions shown; findings below may reference images not displayed]

FINDINGS: Brain: Unchanged 15 mm hematoma in the right paramedian vermis with
mild rim of edema. Bilateral inferior frontal and temporal
hemorrhagic contusions without progressive bleeding. There is also a
low left parietal contusion without increasing blood products. A few
scattered subcortical hemorrhage are compatible with shear injury.
The bleeds show increasing vasogenic edema without significant
increase in mass effect. There is a right frontal ventriculostomy
catheter. Ventricular volume has slightly increased but ventricles
are still decompressed. Catheter tip is in the midline at the level
of the septum pellucidum. Right inferior frontal epidural hematoma
associated with skull fracture, 14 mm in thickness. No complicating
infarct.

Vascular: No hyperdense vessel.

Skull: Bilateral mid face fractures continuing through the right
frontal sinus, orbit, and calvarium. Left temporal bone fracture
with mastoid and middle ear opacification.

Sinuses/Orbits: Extensive bilateral hemosinus.  Stable orbits.
IMPRESSION: 1. Expected increase in vasogenic edema associated with multifocal
contusion and shear-type hemorrhages.
2. No change in right inferior frontal epidural hemorrhage.
3. Ventriculostomy with decompressed ventricles. Ventricular volume
has slightly increased, if no ventriculostomy adjustment this is
likely due to decreasing intracranial pressure as sulci are also
better seen today.

## 2020-11-21 IMAGING — DX DG ORTHOPANTOGRAM /PANORAMIC
1 series · 1 of 1 positions shown · non-contrast
Comparison: CT 11/20/2019, 11/16/2019.

CLINICAL DATA: Facial fracture.

EXAM:
ORTHOPANTOGRAM/PANORAMIC

[view not recorded]
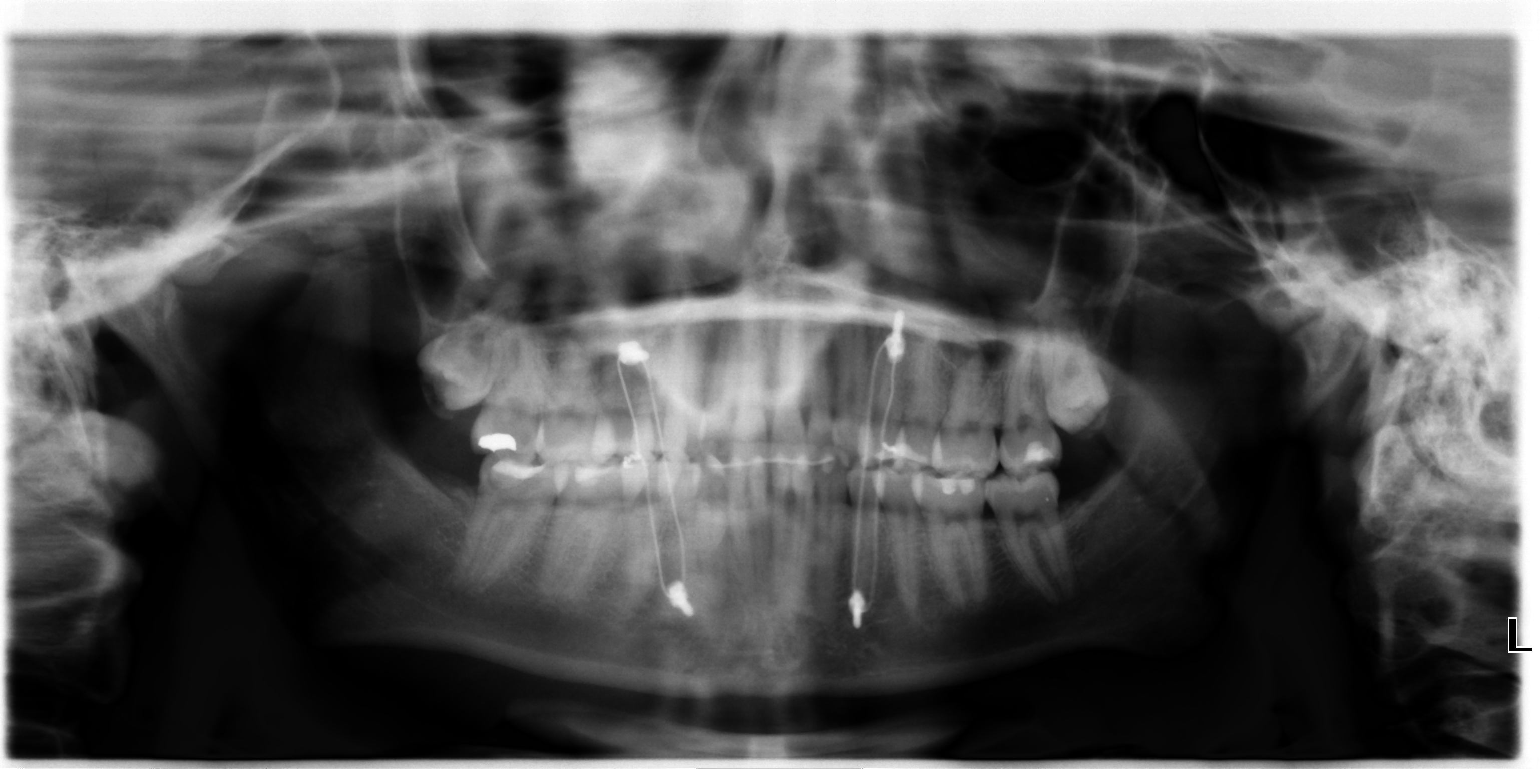

[1 of 1 positions shown; findings below may reference images not displayed]

FINDINGS: Reference is made to CT 11/20/1999 for discussion of skull and
facial fractures present. A fracture of the coronoid process of the
right mandible cannot be excluded. Surgical wiring noted over the
facial region.
IMPRESSION: Reference is made to CT of 11/20/2019 for discussion of skull and
facial fractures present. A fracture of the coronoid process of the
right mandible cannot be excluded. Surgical wiring noted over the
facial region.

## 2021-07-30 ENCOUNTER — Encounter

## 2021-07-30 NOTE — Telephone Encounter (Signed)
Rx loaded for e-scribe

## 2021-07-30 NOTE — Telephone Encounter (Signed)
Patient is requesting her prescription of be sent to a new pharmacy [facility]  on [address].  [facility] is out of medication.      Medication: Adderall  Dosage:30mg   Frequency: 0.5 3 times daily  Quantity:30    Please call and advise.  Thank you

## 2021-08-02 MED ORDER — AMPHETAMINE-DEXTROAMPHETAMINE 30 MG PO TABS
30 MG | ORAL_TABLET | ORAL | 0 refills | Status: DC
Start: 2021-08-02 — End: 2023-02-06

## 2021-08-02 NOTE — Telephone Encounter (Signed)
Patient calling to check on the status of this refill. She is out of medication.

## 2021-08-02 NOTE — Telephone Encounter (Signed)
Pt called back in stating her Adderall wasn't sent in, looks like I uploaded medication since Friday but it hasn't been sent in, please advise

## 2021-08-02 NOTE — Telephone Encounter (Signed)
Informed patient Rx was sent.

## 2021-08-26 ENCOUNTER — Ambulatory Visit
Admit: 2021-08-26 | Discharge: 2021-08-26 | Payer: BLUE CROSS/BLUE SHIELD | Attending: Internal Medicine | Primary: Internal Medicine

## 2021-08-26 DIAGNOSIS — F909 Attention-deficit hyperactivity disorder, unspecified type: Secondary | ICD-10-CM

## 2021-08-26 MED ORDER — AMPHETAMINE-DEXTROAMPHETAMINE 30 MG PO TABS
30 MG | ORAL_TABLET | ORAL | 0 refills | Status: DC
Start: 2021-08-26 — End: 2021-11-03

## 2021-08-26 MED ORDER — AMPHETAMINE-DEXTROAMPHETAMINE 30 MG PO TABS
30 MG | ORAL_TABLET | ORAL | 0 refills | Status: DC
Start: 2021-08-26 — End: 2021-11-25

## 2021-08-26 MED ORDER — DROSPIRENONE-ETHINYL ESTRADIOL 3-0.03 MG PO TABS
PACK | ORAL | 5 refills | Status: DC
Start: 2021-08-26 — End: 2022-01-26

## 2021-08-26 NOTE — Progress Notes (Signed)
Internal Medicine Visit Note- [facility]    Chief Complaint:  No chief complaint on file.       PHQ-9 Total Score: 0 (08/26/2021  9:03 AM)     History of Present Illness:  Patient presents for 29-month follow-up.  She is starting a new job, working remotely.  ADHD-well-controlled on Adderall regimen, reports no adverse side effects.  Requesting refills on her birth control.    Current Outpatient Medications   Medication Sig Dispense Refill    [START ON 09/01/2021] amphetamine-dextroamphetamine (ADDERALL) 30 MG tablet 0.5 Orally tid, Disp-45 45 tablet 0    [START ON 10/01/2021] amphetamine-dextroamphetamine (ADDERALL) 30 MG tablet 0.5 Orally tid, Disp-45 45 tablet 0    [START ON 10/31/2021] amphetamine-dextroamphetamine (ADDERALL) 30 MG tablet 0.5 Orally tid, Disp-45 45 tablet 0    drospirenone-ethinyl estradiol 3-0.03 MG TABS 1 tablet Orally Once a day 1 packet 5    amphetamine-dextroamphetamine (ADDERALL, 30MG ,) 30 MG tablet 0.5 Orally tid 45 tablet 0    aspirin-acetaminophen-caffeine (EXCEDRIN MIGRAINE) 250-250-65 MG per tablet 2 tablets Orally Once a day for 30 day(s)       No current facility-administered medications for this visit.        Past Medical History:   Diagnosis Date    ADHD (attention deficit hyperactivity disorder)     Allergic rhinitis     Deviated septum     Folliculitis     Headache     TBI (traumatic brain injury)     TMJ (dislocation of temporomandibular joint)       Past Surgical History:   Procedure Laterality Date    FACIAL COSMETIC SURGERY  10/2019    JAW AND NOSE      Family History   Problem Relation Age of Onset    Heart Disease Father           Social History       Tobacco History       Smoking Status  Never      Smokeless Tobacco Use  Never                   Social History     Substance and Sexual Activity   Alcohol Use Yes    Alcohol/week: 6.0 standard drinks    Types: 6 Glasses of wine per week      Patient has no known allergies.     Review of Systems:  Review of  Systems   All other systems reviewed and are negative.      Objective    Vitals:    08/26/21 0854   BP: 110/76   Pulse: 76   Resp: 14   Temp: 98 ??F (36.7 ??C)   SpO2: 96%   Weight: 147 lb (66.7 kg)   Height: 5\' 10"  (1.778 m)     Body mass index is 21.09 kg/m??.   Physical Exam  Constitutional:       General: She is not in acute distress.     Appearance: Normal appearance.   Pulmonary:      Effort: No respiratory distress.   Neurological:      Mental Status: She is alert.        Labs:  No results found for: WBC, HGB, HCT, PLT, CHOL, TRIG, HDL, LDLDIRECT, ALT, AST, NA, K, CL, CREATININE, BUN, CO2, TSH, PSA, INR, GLUF, LABA1C, LABMICR       Assessment/Plan:  1. Attention deficit hyperactivity disorder (ADHD), unspecified ADHD type  Assessment &  Plan:  Chronic. Stable. Refill Medications.   Patient has a good understanding of the potential risks and benefits of this medication. Scripts reviewed today.     Orders:  -     amphetamine-dextroamphetamine (ADDERALL) 30 MG tablet; 0.5 Orally tid, Disp-45, Disp-45 tablet, R-0Normal  -     amphetamine-dextroamphetamine (ADDERALL) 30 MG tablet; 0.5 Orally tid, Disp-45, Disp-45 tablet, R-0Normal  -     amphetamine-dextroamphetamine (ADDERALL) 30 MG tablet; 0.5 Orally tid, Disp-45, Disp-45 tablet, R-0Normal        Reinaldo Helt T. Roseanne Reno, DO  Internal Medicine  [facility]  08/26/2021   Return in about 3 months (around 11/26/2021).

## 2021-08-26 NOTE — Assessment & Plan Note (Signed)
Chronic. Stable. Refill Medications.   Patient has a good understanding of the potential risks and benefits of this medication. Scripts reviewed today.

## 2021-11-03 ENCOUNTER — Encounter

## 2021-11-03 MED ORDER — AMPHETAMINE-DEXTROAMPHETAMINE 30 MG PO TABS
30 MG | ORAL_TABLET | ORAL | 0 refills | Status: DC
Start: 2021-11-03 — End: 2021-11-25

## 2021-11-03 NOTE — Telephone Encounter (Signed)
Loaded. Please es.

## 2021-11-03 NOTE — Telephone Encounter (Signed)
Patient is requesting refill    What pharmacy would you like this sent to?  [facility]    Have there been any medication changes since your last visit? no    Medication: adderall  Dosage: 30 mg  Frequency: 0.5 tablet   Quantity: 30    Patient's pharmacy is out of her adderall. She is requesting to have it sent to [facility]. Please advise when sent.

## 2021-11-25 ENCOUNTER — Ambulatory Visit
Admit: 2021-11-25 | Discharge: 2021-11-25 | Payer: BLUE CROSS/BLUE SHIELD | Attending: Internal Medicine | Primary: Internal Medicine

## 2021-11-25 DIAGNOSIS — F909 Attention-deficit hyperactivity disorder, unspecified type: Secondary | ICD-10-CM

## 2021-11-25 MED ORDER — AMPHETAMINE-DEXTROAMPHETAMINE 30 MG PO TABS
30 MG | ORAL_TABLET | ORAL | 0 refills | Status: DC
Start: 2021-11-25 — End: 2022-02-24

## 2021-11-25 MED ORDER — AMPHETAMINE-DEXTROAMPHETAMINE 30 MG PO TABS
30 MG | ORAL_TABLET | ORAL | 0 refills | Status: AC
Start: 2021-11-25 — End: 2021-12-21

## 2021-11-25 NOTE — Progress Notes (Signed)
Internal [facility]- [facility]    Chief Complaint:  Chief Complaint   Patient presents with    Medication Refill        No data recorded     History of Present Illness:  Patient presents for follow-up and med refills.  Nurtec was effective for migraines.  Gets them very infrequently.  ADHD is well controlled on current Adderall regimen, reports no adverse side effects of the medication.    Current Outpatient Medications   Medication Sig Dispense Refill    [START ON 01/31/2022] amphetamine-dextroamphetamine (ADDERALL) 30 MG tablet 0.5 Orally tid, Disp-45 45 tablet 0    [START ON 01/03/2022] amphetamine-dextroamphetamine (ADDERALL) 30 MG tablet 0.5 Orally tid, Disp-45 45 tablet 0    [START ON 12/04/2021] amphetamine-dextroamphetamine (ADDERALL) 30 MG tablet 0.5 Orally tid, Disp-45 45 tablet 0    drospirenone-ethinyl estradiol 3-0.03 MG TABS 1 tablet Orally Once a day 1 packet 5    amphetamine-dextroamphetamine (ADDERALL, 30MG ,) 30 MG tablet 0.5 Orally tid 45 tablet 0    aspirin-acetaminophen-caffeine (EXCEDRIN MIGRAINE) 250-250-65 MG per tablet 2 tablets Orally Once a day for 30 day(s)       No current facility-administered medications for this visit.        Past Medical History:   Diagnosis Date    ADHD (attention deficit hyperactivity disorder)     Allergic rhinitis     Deviated septum     Folliculitis     Headache     TBI (traumatic brain injury)     TMJ (dislocation of temporomandibular joint)       Past Surgical History:   Procedure Laterality Date    FACIAL COSMETIC SURGERY  10/2019    JAW AND NOSE      Family History   Problem Relation Age of Onset    Heart Disease Father           Social History       Tobacco History       Smoking Status  Never      Smokeless Tobacco Use  Never                   Social History     Substance and Sexual Activity   Alcohol Use Yes    Alcohol/week: 6.0 standard drinks    Types: 6 Glasses of wine per week      Patient has no known allergies.     Review of  Systems:  Review of Systems   Constitutional:  Negative for chills, fatigue, fever and unexpected weight change.   Eyes:  Negative for photophobia and visual disturbance.   Respiratory:  Negative for cough, chest tightness, shortness of breath and wheezing.    Cardiovascular:  Negative for chest pain, palpitations and leg swelling.   Gastrointestinal:  Negative for abdominal pain, constipation, diarrhea, nausea and vomiting.   Genitourinary:  Negative for difficulty urinating, dysuria, urgency, vaginal bleeding and vaginal discharge.   Musculoskeletal:  Negative for arthralgias, back pain, joint swelling, myalgias and neck pain.   Skin:  Negative for color change and rash.   Neurological:  Negative for dizziness, tremors, syncope, weakness, numbness and headaches.   Hematological:  Negative for adenopathy.   Psychiatric/Behavioral:  Negative for sleep disturbance and suicidal ideas. The patient is not nervous/anxious.        Objective    Vitals:    11/25/21 0846   BP: 116/78   Pulse: 100   Resp: 16  Temp: 98.3 ??F (36.8 ??C)   SpO2: 99%   Weight: 151 lb (68.5 kg)     Body mass index is 21.67 kg/m??.   Physical Exam  Constitutional:       General: She is not in acute distress.     Appearance: Normal appearance.   Pulmonary:      Effort: No respiratory distress.   Neurological:      Mental Status: She is alert and oriented to person, place, and time.        Labs:  No results found for: WBC, HGB, HCT, PLT, CHOL, TRIG, HDL, LDLDIRECT, ALT, AST, NA, K, CL, CREATININE, BUN, CO2, TSH, PSA, INR, GLUF, LABA1C, LABMICR       Assessment/Plan:  1. Attention deficit hyperactivity disorder (ADHD), unspecified ADHD type  Assessment & Plan:  Chronic. Stable. Refill Medications.   Patient has a good understanding of the potential risks and benefits of this medication. Scripts reviewed today.     Orders:  -     amphetamine-dextroamphetamine (ADDERALL) 30 MG tablet; 0.5 Orally tid, Disp-45, Disp-45 tablet, R-0Normal  -      amphetamine-dextroamphetamine (ADDERALL) 30 MG tablet; 0.5 Orally tid, Disp-45, Disp-45 tablet, R-0Normal  -     amphetamine-dextroamphetamine (ADDERALL) 30 MG tablet; 0.5 Orally tid, Disp-45, Disp-45 tablet, R-0Normal  2. Other migraine without status migrainosus, not intractable  Assessment & Plan:  Chronic.  Stable.  Nurtec samples provided today.        Quang Thorpe T. Roseanne Reno, DO  Internal Medicine  [facility]  11/25/2021   Return in about 3 months (around 02/23/2022).

## 2021-11-25 NOTE — Assessment & Plan Note (Signed)
Chronic.  Stable.  Nurtec samples provided today.

## 2021-11-25 NOTE — Assessment & Plan Note (Signed)
Chronic. Stable. Refill Medications.   Patient has a good understanding of the potential risks and benefits of this medication. Scripts reviewed today.

## 2022-01-14 NOTE — Telephone Encounter (Signed)
Called to clarify, she stated she changed insurance companies so they need a prior auth for her birth control. Pt sent message on mychart, will reply thru mychart.

## 2022-01-14 NOTE — Telephone Encounter (Signed)
From: Rockne Menghini  To: Dr. Brandon Melnick  Sent: 01/14/2022 10:53 AM EST  Subject: Authorization for drospirenone-ethinyl estradiol    Hi - I had called the office about this birth control prescription as it is not covered in my new healthcare plan. I incorrectly said I needed the the prescription overrided, but I don't think that is the right terminology as I really need authorization for an exception for it to be covered, as this is the type of birth control I have always taken. Just wanted to clear that up. Thank you!

## 2022-01-14 NOTE — Telephone Encounter (Signed)
Called pt in another TE, pt stated she needs a prior auth for her birth control as she changed insurance companies. Asked her to send pic of insurance card. Will send a pic of her new insurance-front and back- in order to start the PA. Pt is aware Riley Lam will be back next week to work on this. Hold for [employer]

## 2022-01-14 NOTE — Telephone Encounter (Signed)
Patient called in stated Birth Control medication needs override to be prescribed with her new insurance. Please reach out with any questions. Thank you. Patient did request a call back once this has been completed.

## 2022-01-26 NOTE — Telephone Encounter (Signed)
[id number]. DROSPIR/ETHI TAB 3-0.03MG  is approved through 01/27/2023. Your patient may now fill this prescription and it will be covered.

## 2022-01-26 NOTE — Telephone Encounter (Signed)
PA Case ID: [id number] - Rx #: [id number]

## 2022-01-27 MED ORDER — DROSPIRENONE-ETHINYL ESTRADIOL 3-0.03 MG PO TABS
PACK | ORAL | 5 refills | Status: AC
Start: 2022-01-27 — End: 2022-09-07

## 2022-02-24 ENCOUNTER — Ambulatory Visit: Admit: 2022-02-24 | Discharge: 2022-02-24 | Payer: MEDICARE | Attending: Internal Medicine | Primary: Internal Medicine

## 2022-02-24 DIAGNOSIS — F909 Attention-deficit hyperactivity disorder, unspecified type: Secondary | ICD-10-CM

## 2022-02-24 MED ORDER — AMPHETAMINE-DEXTROAMPHETAMINE 30 MG PO TABS
30 MG | ORAL_TABLET | ORAL | 0 refills | Status: DC
Start: 2022-02-24 — End: 2022-06-07

## 2022-02-24 MED ORDER — AMPHETAMINE-DEXTROAMPHETAMINE 30 MG PO TABS
30 MG | ORAL_TABLET | ORAL | 0 refills | Status: AC
Start: 2022-02-24 — End: 2022-06-02

## 2022-02-24 NOTE — Progress Notes (Signed)
Internal Medicine Visit Note- [facility]    Chief Complaint:  Chief Complaint   Patient presents with    Follow-up     No new complaints. 3 month med refill        PHQ-9 Total Score: 0 (02/24/2022  8:47 AM)       History of Present Illness:  Patient presents for 79-month follow-up and refills.  ADHD is well controlled on current Adderall regimen, reports no adverse side effects of the medication.    Current Outpatient Medications   Medication Sig Dispense Refill    [START ON 05/03/2022] amphetamine-dextroamphetamine (ADDERALL) 30 MG tablet 0.5 Orally tid, Disp-45 45 tablet 0    [START ON 04/03/2022] amphetamine-dextroamphetamine (ADDERALL) 30 MG tablet 0.5 Orally tid, Disp-45 45 tablet 0    [START ON 03/04/2022] amphetamine-dextroamphetamine (ADDERALL) 30 MG tablet 0.5 Orally tid, Disp-45 45 tablet 0    drospirenone-ethinyl estradiol 3-0.03 MG TABS 1 tablet Orally Once a day 1 packet 5    amphetamine-dextroamphetamine (ADDERALL, 30MG ,) 30 MG tablet 0.5 Orally tid 45 tablet 0    aspirin-acetaminophen-caffeine (EXCEDRIN MIGRAINE) 250-250-65 MG per tablet 2 tablets Orally Once a day for 30 day(s)       No current facility-administered medications for this visit.        Past Medical History:   Diagnosis Date    ADHD (attention deficit hyperactivity disorder)     Allergic rhinitis     Deviated septum     Folliculitis     Headache     TBI (traumatic brain injury) (HCC)     TMJ (dislocation of temporomandibular joint)       Past Surgical History:   Procedure Laterality Date    FACIAL COSMETIC SURGERY  10/2019    JAW AND NOSE      Family History   Problem Relation Age of Onset    Heart Disease Father           Social History       Tobacco History       Smoking Status  Never      Smokeless Tobacco Use  Never                   Social History     Substance and Sexual Activity   Alcohol Use Yes    Alcohol/week: 6.0 standard drinks    Types: 6 Glasses of wine per week      Patient has no known allergies.     Review of  Systems:  Review of Systems   All other systems reviewed and are negative.      Objective    Vitals:    02/24/22 0847   BP: 108/76   Pulse: 97   Temp: 97.8 F (36.6 C)   SpO2: 99%   Weight: 151 lb 9.6 oz (68.8 kg)   Height: 5\' 9"  (1.753 m)     Body mass index is 22.39 kg/m.   Physical Exam  Constitutional:       General: She is not in acute distress.     Appearance: Normal appearance.   Pulmonary:      Effort: No respiratory distress.   Neurological:      Mental Status: She is alert and oriented to person, place, and time.        Labs:  No results found for: WBC, HGB, HCT, PLT, CHOL, TRIG, HDL, LDLDIRECT, ALT, AST, NA, K, CL, CREATININE, BUN, CO2, TSH, PSA, INR, GLUF, LABA1C, LABMICR  Assessment/Plan:  1. Attention deficit hyperactivity disorder (ADHD), unspecified ADHD type  Assessment & Plan:  Chronic. Stable. Refill Medications.   Patient has a good understanding of the potential risks and benefits of this medication. Scripts reviewed today.     Orders:  -     amphetamine-dextroamphetamine (ADDERALL) 30 MG tablet; 0.5 Orally tid, Disp-45, Disp-45 tablet, R-0Normal  -     amphetamine-dextroamphetamine (ADDERALL) 30 MG tablet; 0.5 Orally tid, Disp-45, Disp-45 tablet, R-0Normal  -     amphetamine-dextroamphetamine (ADDERALL) 30 MG tablet; 0.5 Orally tid, Disp-45, Disp-45 tablet, R-0Normal          Nyoka Alcoser T. Roseanne Reno, DO  Internal Medicine  [facility]  02/25/2022   Return in about 3 months (around 05/26/2022).

## 2022-02-25 NOTE — Assessment & Plan Note (Signed)
Chronic. Stable. Refill Medications.   Patient has a good understanding of the potential risks and benefits of this medication. Scripts reviewed today.

## 2022-05-30 ENCOUNTER — Encounter: Attending: Internal Medicine | Primary: Internal Medicine

## 2022-06-07 ENCOUNTER — Telehealth: Admit: 2022-06-07 | Discharge: 2022-06-07 | Payer: MEDICARE | Attending: Internal Medicine | Primary: Internal Medicine

## 2022-06-07 DIAGNOSIS — F909 Attention-deficit hyperactivity disorder, unspecified type: Secondary | ICD-10-CM

## 2022-06-07 MED ORDER — AMPHETAMINE-DEXTROAMPHETAMINE 30 MG PO TABS
30 MG | ORAL_TABLET | ORAL | 0 refills | Status: DC
Start: 2022-06-07 — End: 2022-09-07

## 2022-06-07 MED ORDER — AMPHETAMINE-DEXTROAMPHETAMINE 30 MG PO TABS
30 MG | ORAL_TABLET | ORAL | 0 refills | Status: AC
Start: 2022-06-07 — End: 2022-09-07

## 2022-06-07 NOTE — Progress Notes (Signed)
Internal Medicine Visit Note- [name]    Chief Complaint:  No chief complaint on file.       No data recorded     History of Present Illness:  A virtual video visit was conducted and was mutually agreed upon. MyChart Video was used as the interface platform.     Patient presents for 27-month follow-up and med refills.  ADHD is well controlled on current Adderall regimen, reports no adverse side effects to medication.    Current Outpatient Medications   Medication Sig Dispense Refill    [START ON 08/06/2022] amphetamine-dextroamphetamine (ADDERALL) 30 MG tablet 0.5 Orally tid, Disp-45 45 tablet 0    [START ON 07/07/2022] amphetamine-dextroamphetamine (ADDERALL) 30 MG tablet 0.5 Orally tid, Disp-45 45 tablet 0    amphetamine-dextroamphetamine (ADDERALL) 30 MG tablet 0.5 Orally tid, Disp-45 45 tablet 0    drospirenone-ethinyl estradiol 3-0.03 MG TABS 1 tablet Orally Once a day 1 packet 5    amphetamine-dextroamphetamine (ADDERALL, 30MG ,) 30 MG tablet 0.5 Orally tid 45 tablet 0    aspirin-acetaminophen-caffeine (EXCEDRIN MIGRAINE) 250-250-65 MG per tablet 2 tablets Orally Once a day for 30 day(s)       No current facility-administered medications for this visit.        Past Medical History:   Diagnosis Date    ADHD (attention deficit hyperactivity disorder)     Allergic rhinitis     Deviated septum     Folliculitis     Headache     TBI (traumatic brain injury) (HCC)     TMJ (dislocation of temporomandibular joint)       Past Surgical History:   Procedure Laterality Date    FACIAL COSMETIC SURGERY  10/2019    JAW AND NOSE      Family History   Problem Relation Age of Onset    Heart Disease Father           Social History       Tobacco History       Smoking Status  Never      Smokeless Tobacco Use  Never                   Social History     Substance and Sexual Activity   Alcohol Use Yes    Alcohol/week: 6.0 standard drinks    Types: 6 Glasses of wine per week      Patient has no known allergies.      Review of Systems:  Review of Systems   All other systems reviewed and are negative.      Objective    There were no vitals filed for this visit.  There is no height or weight on file to calculate BMI.   Physical Exam  Constitutional:       General: She is not in acute distress.     Appearance: Normal appearance.   Neurological:      Mental Status: She is alert and oriented to person, place, and time.        Labs:  No results found for: WBC, HGB, HCT, PLT, CHOL, TRIG, HDL, LDLDIRECT, ALT, AST, NA, K, CL, CREATININE, BUN, CO2, TSH, PSA, INR, GLUF, LABA1C       Assessment/Plan:  1. Attention deficit hyperactivity disorder (ADHD), unspecified ADHD type  Assessment & Plan:  Chronic. Stable. Refill Medications.   Patient has a good understanding of the potential risks and benefits of this medication. Scripts reviewed today.  Orders:  -     amphetamine-dextroamphetamine (ADDERALL) 30 MG tablet; 0.5 Orally tid, Disp-45, Disp-45 tablet, R-0Normal  -     amphetamine-dextroamphetamine (ADDERALL) 30 MG tablet; 0.5 Orally tid, Disp-45, Disp-45 tablet, R-0Normal  -     amphetamine-dextroamphetamine (ADDERALL) 30 MG tablet; 0.5 Orally tid, Disp-45, Disp-45 tablet, R-0Normal          Vinita Prentiss T. Roseanne Reno, DO  Internal Medicine  [facility]  06/14/2022   Return in about 3 months (around 09/07/2022).

## 2022-06-14 NOTE — Assessment & Plan Note (Signed)
Chronic. Stable. Refill Medications.   Patient has a good understanding of the potential risks and benefits of this medication. Scripts reviewed today.

## 2022-09-07 ENCOUNTER — Ambulatory Visit: Admit: 2022-09-07 | Discharge: 2022-09-07 | Payer: MEDICARE | Attending: Internal Medicine | Primary: Internal Medicine

## 2022-09-07 DIAGNOSIS — F909 Attention-deficit hyperactivity disorder, unspecified type: Secondary | ICD-10-CM

## 2022-09-07 MED ORDER — AMPHETAMINE-DEXTROAMPHETAMINE 30 MG PO TABS
30 MG | ORAL_TABLET | ORAL | 0 refills | Status: DC
Start: 2022-09-07 — End: 2022-12-07

## 2022-09-07 MED ORDER — DROSPIRENONE-ETHINYL ESTRADIOL 3-0.03 MG PO TABS
3-0.03 MG | PACK | ORAL | 5 refills | Status: DC
Start: 2022-09-07 — End: 2023-03-08

## 2022-09-07 NOTE — Progress Notes (Signed)
Internal Medicine Visit Note- [facility]    Chief Complaint:  Chief Complaint   Patient presents with    Other     3 month follow up ADHD-Refill birth control        No data recorded     History of Present Illness:  Patient presents for the following med refills.  ADHD is well controlled on current Adderall regimen, reports no adverse side effects of medication.    Current Outpatient Medications   Medication Sig Dispense Refill    [START ON 11/06/2022] amphetamine-dextroamphetamine (ADDERALL) 30 MG tablet 0.5 Orally tid, Disp-45 45 tablet 0    [START ON 10/07/2022] amphetamine-dextroamphetamine (ADDERALL) 30 MG tablet 0.5 Orally tid, Disp-45 45 tablet 0    amphetamine-dextroamphetamine (ADDERALL) 30 MG tablet 0.5 Orally tid, Disp-45 45 tablet 0    drospirenone-ethinyl estradiol 3-0.03 MG TABS 1 tablet Orally Once a day 1 packet 5    amphetamine-dextroamphetamine (ADDERALL, 30MG ,) 30 MG tablet 0.5 Orally tid 45 tablet 0    aspirin-acetaminophen-caffeine (EXCEDRIN MIGRAINE) 250-250-65 MG per tablet 2 tablets Orally Once a day for 30 day(s)       No current facility-administered medications for this visit.        Past Medical History:   Diagnosis Date    ADHD (attention deficit hyperactivity disorder)     Allergic rhinitis     Deviated septum     Folliculitis     Headache     TBI (traumatic brain injury) (HCC)     TMJ (dislocation of temporomandibular joint)       Past Surgical History:   Procedure Laterality Date    FACIAL COSMETIC SURGERY  10/2019    JAW AND NOSE      Family History   Problem Relation Age of Onset    Heart Disease Father           Social History       Tobacco History       Smoking Status  Never      Smokeless Tobacco Use  Never                   Social History     Substance and Sexual Activity   Alcohol Use Yes    Alcohol/week: 6.0 standard drinks of alcohol    Types: 6 Glasses of wine per week      Patient has no known allergies.     Review of Systems:  Review of Systems   All other  systems reviewed and are negative.        Objective    Vitals:    09/07/22 0835   BP: 110/70   Site: Right Upper Arm   Position: Sitting   Pulse: 85   Temp: 97.7 F (36.5 C)   SpO2: 99%   Weight: 65.4 kg (144 lb 3.2 oz)     Body mass index is 21.29 kg/m.   Physical Exam  Constitutional:       General: She is not in acute distress.     Appearance: Normal appearance.   Pulmonary:      Effort: No respiratory distress.   Neurological:      Mental Status: She is alert and oriented to person, place, and time.          Labs:  No results found for: "WBC", "HGB", "HCT", "PLT", "CHOL", "TRIG", "HDL", "LDLDIRECT", "ALT", "AST", "NA", "K", "CL", "CREATININE", "BUN", "CO2", "TSH", "PSA", "INR", "GLUF", "LABA1C"  Assessment/Plan:  1. Attention deficit hyperactivity disorder (ADHD), unspecified ADHD type  Assessment & Plan:  Chronic. Stable. Refill Medications.   Patient has a good understanding of the potential risks and benefits of this medication. Scripts reviewed today.     Orders:  -     amphetamine-dextroamphetamine (ADDERALL) 30 MG tablet; 0.5 Orally tid, Disp-45, Disp-45 tablet, R-0Normal  -     amphetamine-dextroamphetamine (ADDERALL) 30 MG tablet; 0.5 Orally tid, Disp-45, Disp-45 tablet, R-0Normal  -     amphetamine-dextroamphetamine (ADDERALL) 30 MG tablet; 0.5 Orally tid, Disp-45, Disp-45 tablet, R-0Normal          Kjirsten Bloodgood T. Nicole Kindred, DO  Internal Medicine  [facility]  09/07/2022   Return in about 3 months (around 12/08/2022).

## 2022-09-07 NOTE — Assessment & Plan Note (Signed)
Chronic. Stable. Refill Medications.   Patient has a good understanding of the potential risks and benefits of this medication. Scripts reviewed today.

## 2022-11-07 ENCOUNTER — Telehealth

## 2022-11-07 NOTE — Telephone Encounter (Signed)
Patient is calling because pharmacy is out of stock of amphetamine-dextroamphetamine (ADDERALL) 30 MG tablet. Patient is needing the 20 MG dose called in instead. Please assist.     [facility], [address] [place]

## 2022-11-08 MED ORDER — AMPHETAMINE-DEXTROAMPHETAMINE 20 MG PO TABS
20 MG | ORAL_TABLET | Freq: Two times a day (BID) | ORAL | 0 refills | Status: DC
Start: 2022-11-08 — End: 2023-03-08

## 2022-11-08 NOTE — Telephone Encounter (Signed)
Called and cancelled the 30 mg Adderall; pharmacy will look out for the Adderall 20 mg 1.5 tablets prescription when it is sent

## 2022-11-08 NOTE — Addendum Note (Signed)
Addended by: Jamey Reas on: 11/08/2022 01:08 PM     Modules accepted: Orders

## 2022-12-07 ENCOUNTER — Ambulatory Visit: Admit: 2022-12-07 | Discharge: 2022-12-07 | Payer: MEDICARE | Attending: Internal Medicine | Primary: Internal Medicine

## 2022-12-07 DIAGNOSIS — G43809 Other migraine, not intractable, without status migrainosus: Secondary | ICD-10-CM

## 2022-12-07 MED ORDER — AMPHETAMINE-DEXTROAMPHETAMINE 30 MG PO TABS
30 MG | ORAL_TABLET | ORAL | 0 refills | Status: AC
Start: 2022-12-07 — End: 2023-01-08

## 2022-12-07 MED ORDER — AMPHETAMINE-DEXTROAMPHETAMINE 30 MG PO TABS
30 MG | ORAL_TABLET | ORAL | 0 refills | Status: AC
Start: 2022-12-07 — End: 2023-02-19

## 2022-12-07 MED ORDER — AMPHETAMINE-DEXTROAMPHETAMINE 30 MG PO TABS
30 MG | ORAL_TABLET | ORAL | 0 refills | Status: AC
Start: 2022-12-07 — End: 2023-02-05

## 2022-12-07 NOTE — Progress Notes (Signed)
Internal Medicine Visit Note- [facility]    Chief Complaint:  Chief Complaint   Patient presents with    Follow-up     Med check/refills        PHQ-9 Total Score: 0 (12/07/2022  8:25 AM)       History of Present Illness:  Patient presents for 6-month follow-up and medication refills.  ADHD is well-controlled on current Adderall regimen, reports no adverse side effects to the medication.    Current Outpatient Medications   Medication Sig Dispense Refill    [START ON 02/06/2023] amphetamine-dextroamphetamine (ADDERALL) 30 MG tablet 0.5 Orally tid, Disp-45 45 tablet 0    [START ON 01/07/2023] amphetamine-dextroamphetamine (ADDERALL) 30 MG tablet 0.5 Orally tid, Disp-45 45 tablet 0    [START ON 12/08/2022] amphetamine-dextroamphetamine (ADDERALL) 30 MG tablet 0.5 Orally tid, Disp-45 45 tablet 0    amphetamine-dextroamphetamine (ADDERALL, 20MG ,) 20 MG tablet Take 1 tablet by mouth 2 times daily for 30 days. Max Daily Amount: 40 mg 60 tablet 0    drospirenone-ethinyl estradiol 3-0.03 MG TABS 1 tablet Orally Once a day 1 packet 5    amphetamine-dextroamphetamine (ADDERALL, 30MG ,) 30 MG tablet 0.5 Orally tid 45 tablet 0    aspirin-acetaminophen-caffeine (EXCEDRIN MIGRAINE) 250-250-65 MG per tablet 2 tablets Orally Once a day for 30 day(s)       No current facility-administered medications for this visit.        Past Medical History:   Diagnosis Date    ADHD (attention deficit hyperactivity disorder)     Allergic rhinitis     Deviated septum     Folliculitis     Headache     TBI (traumatic brain injury) ([place])     TMJ (dislocation of temporomandibular joint)       Past Surgical History:   Procedure Laterality Date    FACIAL COSMETIC SURGERY  10/2019    JAW AND NOSE    FRACTURE SURGERY        Family History   Problem Relation Age of Onset    Heart Disease Father           Social History       Tobacco History       Smoking Status  Never      Smokeless Tobacco Use  Never                   Social History      Substance and Sexual Activity   Alcohol Use Yes    Alcohol/week: 3.0 standard drinks of alcohol    Types: 3 Glasses of wine per week      Patient has no known allergies.     Review of Systems:  Review of Systems   All other systems reviewed and are negative.        Objective    Vitals:    12/07/22 0823   BP: 110/70   Pulse: 73   Resp: 18   SpO2: 97%   Weight: 66.2 kg (146 lb)   Height: 1.753 m (5\' 9" )     Body mass index is 21.56 kg/m.   Physical Exam  Constitutional:       General: She is not in acute distress.     Appearance: Normal appearance.   Pulmonary:      Effort: No respiratory distress.   Neurological:      Mental Status: She is alert and oriented to person, place, and time.  Labs:  No results found for: "WBC", "HGB", "HCT", "PLT", "CHOL", "TRIG", "HDL", "LDLDIRECT", "ALT", "AST", "NA", "K", "CL", "CREATININE", "BUN", "CO2", "TSH", "PSA", "INR", "GLUF", "LABA1C"       Assessment/Plan:  1. Other migraine without status migrainosus, not intractable  Assessment & Plan:  Chronic.  Stable.  Provided samples of Nurtec today.  2. Attention deficit hyperactivity disorder (ADHD), unspecified ADHD type  Assessment & Plan:  Chronic. Stable. Refill Medications.   Patient has a good understanding of the potential risks and benefits of this medication. PDMP database reviewed today.     Orders:  -     amphetamine-dextroamphetamine (ADDERALL) 30 MG tablet; 0.5 Orally tid, Disp-45, Disp-45 tablet, R-0Normal  -     amphetamine-dextroamphetamine (ADDERALL) 30 MG tablet; 0.5 Orally tid, Disp-45, Disp-45 tablet, R-0Normal  -     amphetamine-dextroamphetamine (ADDERALL) 30 MG tablet; 0.5 Orally tid, Disp-45, Disp-45 tablet, R-0Normal          Sunil Hue T. Nicole Kindred, DO  Internal Medicine  [facility]  12/07/2022   Return in about 3 months (around 03/08/2023).

## 2022-12-07 NOTE — Assessment & Plan Note (Signed)
Chronic. Stable. Refill Medications.   Patient has a good understanding of the potential risks and benefits of this medication. PDMP database reviewed today.

## 2022-12-07 NOTE — Assessment & Plan Note (Signed)
Chronic.  Stable.  Provided samples of Nurtec today.

## 2022-12-23 ENCOUNTER — Encounter

## 2022-12-23 NOTE — Telephone Encounter (Signed)
Pls advise

## 2022-12-26 MED ORDER — AMPHETAMINE-DEXTROAMPHETAMINE 30 MG PO TABS
30 MG | ORAL_TABLET | ORAL | 0 refills | Status: DC
Start: 2022-12-26 — End: 2023-03-08

## 2022-12-26 NOTE — Telephone Encounter (Signed)
Spoke with [facility] at [place]  The script for 2/17 has been cancelled.

## 2023-01-05 NOTE — Telephone Encounter (Signed)
[facility] calling wanted to confirm if the (ADDERALL) 30 early refill is okay to release to the patient. Because they only received the cancellation of the refill but not the reason why the patient getting the refill early pharmacy requesting the notes is okay to provide refill due to travel reason.    Please assist

## 2023-02-04 ENCOUNTER — Encounter

## 2023-02-06 MED ORDER — AMPHETAMINE-DEXTROAMPHETAMINE 30 MG PO TABS
30 | ORAL_TABLET | ORAL | 0 refills | Status: AC
Start: 2023-02-06 — End: 2023-03-08

## 2023-02-06 NOTE — Telephone Encounter (Signed)
From: [facility]  To: Dr. Jamey Reas  Sent: 02/04/2023 9:25 AM EDT  Subject: Adderall Refill Inactive     Hi Dr. Nicole Kindred,     My last adderall refill in February was filled early due to a vacation override (thank you for handling that), and Im not sure what happened but now the pharmacy is saying my refill due for today is inactive and they are not able to refill it. Can this be re-prescribed to be filled on Monday?     Thank you!

## 2023-03-08 ENCOUNTER — Ambulatory Visit: Admit: 2023-03-08 | Discharge: 2023-03-08 | Payer: MEDICARE | Attending: Internal Medicine | Primary: Internal Medicine

## 2023-03-08 DIAGNOSIS — F909 Attention-deficit hyperactivity disorder, unspecified type: Secondary | ICD-10-CM

## 2023-03-08 MED ORDER — DROSPIRENONE-ETHINYL ESTRADIOL 3-0.03 MG PO TABS
3-0.03 MG | PACK | ORAL | 5 refills | Status: AC
Start: 2023-03-08 — End: 2023-09-14

## 2023-03-08 MED ORDER — AMPHETAMINE-DEXTROAMPHETAMINE 30 MG PO TABS
30 MG | ORAL_TABLET | ORAL | 0 refills | Status: AC
Start: 2023-03-08 — End: 2023-06-06

## 2023-03-08 MED ORDER — AMPHETAMINE-DEXTROAMPHETAMINE 30 MG PO TABS
30 MG | ORAL_TABLET | ORAL | 0 refills | Status: AC
Start: 2023-03-08 — End: 2023-05-07

## 2023-03-08 MED ORDER — AMPHETAMINE-DEXTROAMPHETAMINE 30 MG PO TABS
30 MG | ORAL_TABLET | ORAL | 0 refills | Status: AC
Start: 2023-03-08 — End: 2023-04-08

## 2023-03-08 NOTE — Progress Notes (Signed)
Internal Medicine Visit Note- [name]    Chief Complaint:  Chief Complaint   Patient presents with    Medication Check        No data recorded     History of Present Illness:  Patient presents for medication refills.  ADHD is well-controlled on current Adderall regimen, reports no adverse side effects to medication.    Current Outpatient Medications   Medication Sig Dispense Refill    amphetamine-dextroamphetamine (ADDERALL) 30 MG tablet 0.5 Orally tid, Disp-45 45 tablet 0    [START ON 04/07/2023] amphetamine-dextroamphetamine (ADDERALL) 30 MG tablet 0.5 Orally tid, Disp-45 45 tablet 0    [START ON 05/07/2023] amphetamine-dextroamphetamine (ADDERALL) 30 MG tablet 0.5 Orally tid, Disp-45 45 tablet 0    drospirenone-ethinyl estradiol 3-0.03 MG TABS 1 tablet Orally Once a day 1 packet 5    aspirin-acetaminophen-caffeine (EXCEDRIN MIGRAINE) 250-250-65 MG per tablet 2 tablets Orally Once a day for 30 day(s)      amphetamine-dextroamphetamine (ADDERALL, ,) 30 MG tablet 0.5 Orally tid 45 tablet 0     No current facility-administered medications for this visit.        Past Medical History:   Diagnosis Date    ADHD (attention deficit hyperactivity disorder)     Allergic rhinitis     Deviated septum     Folliculitis     Headache     TBI (traumatic brain injury) (HCC)     TMJ (dislocation of temporomandibular joint)       Past Surgical History:   Procedure Laterality Date    FACIAL COSMETIC SURGERY  10/2019    JAW AND NOSE    FRACTURE SURGERY        Family History   Problem Relation Age of Onset    Heart Disease Father           Social History       Tobacco History       Smoking Status  Never      Smokeless Tobacco Use  Never                   Social History     Substance and Sexual Activity   Alcohol Use Yes    Alcohol/week: 3.0 standard drinks of alcohol    Types: 3 Glasses of wine per week      Patient has no known allergies.     Review of Systems:  Review of Systems   All other systems reviewed and  are negative.        Objective    Vitals:    03/08/23 1212   BP: 110/80   Site: Right Upper Arm   Position: Sitting   Cuff Size: Medium Adult   Pulse: 87   Resp: 16   Temp: 98.1 F (36.7 C)   TempSrc: Oral   SpO2: 99%   Weight: 65.6 kg (144 lb 9.6 oz)   Height: 1.778 m ( )     Body mass index is 20.75 kg/m.   Physical Exam  Constitutional:       General: She is not in acute distress.     Appearance: Normal appearance.   Pulmonary:      Effort: No respiratory distress.   Neurological:      Mental Status: She is alert and oriented to person, place, and time.          Labs:  No results found for: "WBC", "HGB", "HCT", "PLT", "CHOL", "TRIG", "HDL", "LDLDIRECT", "ALT", "  AST", "NA", "K", "CL", "CREATININE", "BUN", "CO2", "TSH", "PSA", "INR", "GLUF", "LABA1C"       Assessment/Plan:  1. Attention deficit hyperactivity disorder (ADHD), unspecified ADHD type  -     amphetamine-dextroamphetamine (ADDERALL) 30 MG tablet; 0.5 Orally tid, Disp-45, Disp-45 tablet, R-0Normal  -     amphetamine-dextroamphetamine (ADDERALL) 30 MG tablet; 0.5 Orally tid, Disp-45, Disp-45 tablet, R-0Normal  -     amphetamine-dextroamphetamine (ADDERALL) 30 MG tablet; 0.5 Orally tid, Disp-45, Disp-45 tablet, R-0Normal    Chronic. Stable. Refill Medications.   Patient has a good understanding of the potential risks and benefits of this medication. PDMP database reviewed today.         Freedom Lopezperez T. Roseanne Reno, DO  Internal Medicine  [facility]  03/08/2023   Return in about 3 months (around 06/07/2023).

## 2023-06-08 ENCOUNTER — Ambulatory Visit
Admit: 2023-06-08 | Discharge: 2023-06-08 | Payer: PRIVATE HEALTH INSURANCE | Attending: Internal Medicine | Primary: Internal Medicine

## 2023-06-08 DIAGNOSIS — F909 Attention-deficit hyperactivity disorder, unspecified type: Secondary | ICD-10-CM

## 2023-06-08 MED ORDER — AMPHETAMINE-DEXTROAMPHETAMINE 30 MG PO TABS
30 MG | ORAL_TABLET | ORAL | 0 refills | Status: AC
Start: 2023-06-08 — End: 2023-07-09

## 2023-06-08 MED ORDER — AMPHETAMINE-DEXTROAMPHETAMINE 30 MG PO TABS
30 MG | ORAL_TABLET | ORAL | 0 refills | Status: AC
Start: 2023-06-08 — End: 2023-08-08

## 2023-06-08 MED ORDER — AMPHETAMINE-DEXTROAMPHETAMINE 30 MG PO TABS
30 MG | ORAL_TABLET | ORAL | 0 refills | Status: AC
Start: 2023-06-08 — End: 2023-09-07

## 2023-06-08 NOTE — Assessment & Plan Note (Signed)
Chronic.  Stable.  Provided additional Nurtec samples today.

## 2023-06-08 NOTE — Progress Notes (Signed)
Internal Medicine Visit Note- [facility]    Chief Complaint:  Chief Complaint   Patient presents with    Follow-up     3mos        No data recorded     History of Present Illness:  Patient presents for 67-month follow-up and medication refills.  ADHD is well-controlled on current Adderall regimen, reports no adverse side effects to the medication.    Current Outpatient Medications   Medication Sig Dispense Refill    amphetamine-dextroamphetamine (ADDERALL) 30 MG tablet 0.5 Orally tid, Disp-45 45 tablet 0    [START ON 07/08/2023] amphetamine-dextroamphetamine (ADDERALL) 30 MG tablet 0.5 Orally tid, Disp-45 45 tablet 0    [START ON 08/07/2023] amphetamine-dextroamphetamine (ADDERALL) 30 MG tablet 0.5 Orally tid, Disp-45 45 tablet 0    drospirenone-ethinyl estradiol 3-0.03 MG TABS 1 tablet Orally Once a day 1 packet 5    amphetamine-dextroamphetamine (ADDERALL, 30MG ,) 30 MG tablet 0.5 Orally tid 45 tablet 0    aspirin-acetaminophen-caffeine (EXCEDRIN MIGRAINE) 250-250-65 MG per tablet 2 tablets Orally Once a day for 30 day(s) (Patient not taking: Reported on 06/08/2023)       No current facility-administered medications for this visit.        Past Medical History:   Diagnosis Date    ADHD (attention deficit hyperactivity disorder)     Allergic rhinitis     Deviated septum     Folliculitis     Headache     TBI (traumatic brain injury) (HCC)     TMJ (dislocation of temporomandibular joint)       Past Surgical History:   Procedure Laterality Date    FACIAL COSMETIC SURGERY  10/2019    JAW AND NOSE    FRACTURE SURGERY        Family History   Problem Relation Age of Onset    Heart Disease Father           Social History       Tobacco History       Smoking Status  Never      Smokeless Tobacco Use  Never                   Social History     Substance and Sexual Activity   Alcohol Use Yes    Alcohol/week: 3.0 standard drinks of alcohol    Types: 3 Glasses of wine per week      Patient has no known allergies.      Review of Systems:  Review of Systems   All other systems reviewed and are negative.        Objective    Vitals:    06/08/23 0833   BP: 98/76   Site: Left Upper Arm   Position: Sitting   Cuff Size: Medium Adult   Pulse: 84   Resp: 16   SpO2: 97%   Weight: 60.1 kg (132 lb 6.4 oz)     Body mass index is 19 kg/m.   Physical Exam  Vitals reviewed.   Constitutional:       Appearance: Normal appearance.   HENT:      Head: Normocephalic and atraumatic.   Cardiovascular:      Rate and Rhythm: Normal rate and regular rhythm.   Pulmonary:      Effort: Pulmonary effort is normal.      Breath sounds: Normal breath sounds.   Musculoskeletal:         General: No swelling.  Neurological:      Mental Status: She is alert and oriented to person, place, and time.          Labs:  No results found for: "WBC", "HGB", "HCT", "PLT", "CHOL", "TRIG", "HDL", "LDLDIRECT", "ALT", "AST", "NA", "K", "CL", "CREATININE", "BUN", "CO2", "TSH", "PSA", "INR", "GLUF", "LABA1C"       Assessment/Plan:  1. Attention deficit hyperactivity disorder (ADHD), unspecified ADHD type  Assessment & Plan:  Chronic. Stable. Refill Medications.   Patient has a good understanding of the potential risks and benefits of this medication. PDMP database reviewed today.     Orders:  -     amphetamine-dextroamphetamine (ADDERALL) 30 MG tablet; 0.5 Orally tid, Disp-45, Disp-45 tablet, R-0Normal  -     amphetamine-dextroamphetamine (ADDERALL) 30 MG tablet; 0.5 Orally tid, Disp-45, Disp-45 tablet, R-0Normal  -     amphetamine-dextroamphetamine (ADDERALL) 30 MG tablet; 0.5 Orally tid, Disp-45, Disp-45 tablet, R-0Normal  2. General medical exam  Assessment & Plan:  Obtain baseline set of blood work.  Orders:  -     Lipid Panel; Future  -     Comprehensive Metabolic Panel; Future  -     CBC with Auto Differential; Future  -     TSH with Reflex ([facility]ERNER); Future  3. Other migraine without status migrainosus, not intractable  Assessment & Plan:  Chronic.  Stable.  Provided  additional Nurtec samples today.          Millianna Szymborski T. Roseanne Reno, DO  Internal Medicine  [facility]  06/08/2023   Return in about 3 months (around 09/08/2023).

## 2023-06-08 NOTE — Assessment & Plan Note (Signed)
Chronic. Stable. Refill Medications.   Patient has a good understanding of the potential risks and benefits of this medication. PDMP database reviewed today.

## 2023-06-08 NOTE — Assessment & Plan Note (Signed)
Obtain baseline set of blood work.

## 2023-09-01 ENCOUNTER — Encounter

## 2023-09-01 LAB — CBC WITH AUTO DIFFERENTIAL
Basophils %: 0.9 % (ref 0.0–2.0)
Basophils Absolute: 0 10*3/uL (ref 0.0–0.2)
Eosinophils %: 2.1 % (ref 0.0–7.0)
Eosinophils Absolute: 0.1 10*3/uL (ref 0.0–0.5)
Hematocrit: 41.4 % (ref 34.0–47.0)
Hemoglobin: 14 g/dL (ref 11.5–15.7)
Immature Grans (Abs): 0 10*3/uL (ref 0.00–0.06)
Immature Granulocytes %: 0 % (ref 0.0–0.6)
Lymphocytes Absolute: 2 10*3/uL (ref 1.0–3.2)
Lymphocytes: 46.4 % — ABNORMAL HIGH (ref 15.0–45.0)
MCH: 30 pg (ref 27.0–34.5)
MCHC: 33.8 g/dL (ref 30.0–36.0)
MCV: 88.8 fL (ref 81.0–99.0)
MPV: 11.8 fL (ref 7.0–12.2)
Monocytes %: 6.3 % (ref 4.0–12.0)
Monocytes Absolute: 0.3 10*3/uL (ref 0.3–1.0)
NRBC Absolute: 0 10*3/uL (ref 0.000–0.012)
NRBC Automated: 0 % (ref 0.0–0.2)
Neutrophils %: 44.3 % (ref 42.0–74.0)
Neutrophils Absolute: 1.9 10*3/uL (ref 1.6–7.3)
Platelets: 176 10*3/uL (ref 140–440)
RBC: 4.66 x10e6/mcL (ref 3.60–5.20)
RDW: 12 % (ref 10.0–17.0)
WBC: 4.3 10*3/uL (ref 3.8–10.6)

## 2023-09-01 LAB — COMPREHENSIVE METABOLIC PANEL
ALT: 24 U/L (ref 0–35)
AST: 21 U/L (ref 0–35)
Albumin/Globulin Ratio: 1.4 (ref 1.00–2.70)
Albumin: 4.1 g/dL (ref 3.5–5.2)
Alk Phosphatase: 65 U/L (ref 35–117)
Anion Gap: 13 mmol/L (ref 2–17)
BUN: 5 mg/dL — ABNORMAL LOW (ref 6–20)
CO2: 24 mmol/L (ref 22–29)
Calcium: 9 mg/dL (ref 8.5–10.7)
Chloride: 105 mmol/L (ref 98–107)
Creatinine: 0.8 mg/dL (ref 0.5–1.0)
Est, Glom Filt Rate: 100 mL/min/1.73mÂ² (ref >=60)
Globulin: 3 g/dL (ref 1.9–4.4)
Glucose: 90 mg/dL (ref 70–99)
Osmolaliy Calculated: 280 mosm/kg (ref 270–287)
Potassium: 3.6 mmol/L (ref 3.5–5.3)
Sodium: 142 mmol/L (ref 135–145)
Total Bilirubin: 0.23 mg/dL (ref 0.00–1.20)
Total Protein: 7.1 g/dL (ref 5.7–8.3)

## 2023-09-01 LAB — LIPID PANEL
Chol/HDL Ratio: 2.2 (ref 0.0–4.4)
Cholesterol, Total: 192 mg/dL (ref 100–200)
HDL: 87 mg/dL (ref >=50)
LDL Cholesterol: 86 mg/dL (ref 0.0–100.0)
LDL/HDL Ratio: 1
Triglycerides: 95 mg/dL (ref 0–149)
VLDL: 19 mg/dL (ref 5.0–40.0)

## 2023-09-01 LAB — TSH WITH REFLEX: TSH: 1.53 u[IU]/mL (ref 0.358–3.740)

## 2023-09-13 ENCOUNTER — Encounter: Payer: PRIVATE HEALTH INSURANCE | Attending: Internal Medicine | Primary: Internal Medicine

## 2023-09-14 ENCOUNTER — Encounter
Admit: 2023-09-14 | Discharge: 2023-09-14 | Payer: PRIVATE HEALTH INSURANCE | Attending: Internal Medicine | Primary: Internal Medicine

## 2023-09-14 DIAGNOSIS — F909 Attention-deficit hyperactivity disorder, unspecified type: Secondary | ICD-10-CM

## 2023-09-14 MED ORDER — AMPHETAMINE-DEXTROAMPHETAMINE 30 MG PO TABS
30 MG | ORAL_TABLET | ORAL | 0 refills | Status: DC
Start: 2023-09-14 — End: 2023-12-13

## 2023-09-14 MED ORDER — DROSPIRENONE-ETHINYL ESTRADIOL 3-0.03 MG PO TABS
PACK | ORAL | 12 refills | Status: AC
Start: 2023-09-14 — End: ?

## 2023-09-14 NOTE — Progress Notes (Signed)
Internal Medicine Visit Note- [facility]    Chief Complaint:  Chief Complaint   Patient presents with    Follow-up     3 months          No data recorded     History of Present Illness:  Patient presents for 60-month follow-up and med refills.  ADHD is well-controlled on current Adderall regimen, reports no adverse side effects to medication.  She had blood work reviewed that was great.  Return from good trip to [place].  She is doing well overall.  Has no acute complaints today.    Current Outpatient Medications   Medication Sig Dispense Refill    amphetamine-dextroamphetamine (ADDERALL) 30 MG tablet 0.5 Orally tid, Disp-45 45 tablet 0    [START ON 10/14/2023] amphetamine-dextroamphetamine (ADDERALL) 30 MG tablet 0.5 Orally tid, Disp-45 45 tablet 0    [START ON 11/13/2023] amphetamine-dextroamphetamine (ADDERALL) 30 MG tablet 0.5 Orally tid, Disp-45 45 tablet 0    drospirenone-ethinyl estradiol 3-0.03 MG TABS 1 tablet Orally Once a day 1 packet 12    aspirin-acetaminophen-caffeine (EXCEDRIN MIGRAINE) 250-250-65 MG per tablet       amphetamine-dextroamphetamine (ADDERALL, 30MG ,) 30 MG tablet 0.5 Orally tid 45 tablet 0     No current facility-administered medications for this visit.        Past Medical History:   Diagnosis Date    ADHD (attention deficit hyperactivity disorder)     Allergic rhinitis     Deviated septum     Folliculitis     Headache     TBI (traumatic brain injury)     TMJ (dislocation of temporomandibular joint)       Past Surgical History:   Procedure Laterality Date    FACIAL COSMETIC SURGERY  10/2019    JAW AND NOSE    FRACTURE SURGERY        Family History   Problem Relation Age of Onset    Heart Disease Father           Social History       Tobacco History       Smoking Status  Never      Smokeless Tobacco Use  Never                   Social History     Substance and Sexual Activity   Alcohol Use Yes    Alcohol/week: 3.0 standard drinks of alcohol    Types: 3 Glasses of wine per  week      Patient has no known allergies.     Review of Systems:  Review of Systems   All other systems reviewed and are negative.        Objective    Vitals:    09/14/23 0830   BP: 116/82   Site: Left Upper Arm   Position: Sitting   Cuff Size: Medium Adult   Pulse: 87   Resp: 18   Temp: 97.5 F (36.4 C)   TempSrc: Oral   SpO2: 97%   Weight: 61.1 kg (134 lb 9.6 oz)   Height: 1.753 m (5\' 9" )     Body mass index is 19.88 kg/m.   Physical Exam  Constitutional:       General: She is not in acute distress.     Appearance: Normal appearance.   Pulmonary:      Effort: No respiratory distress.   Neurological:      Mental Status: She is alert and oriented  to person, place, and time.          Labs:  Lab Results   Component Value Date    WBC 4.3 09/01/2023    HGB 14.0 09/01/2023    HCT 41.4 09/01/2023    PLT 176 09/01/2023    CHOL 192 09/01/2023    TRIG 95 09/01/2023    HDL 87 09/01/2023    ALT 24 09/01/2023    AST 21 09/01/2023    NA 142 09/01/2023    K 3.6 09/01/2023    CL 105 09/01/2023    CREATININE 0.8 09/01/2023    BUN 5 (L) 09/01/2023    CO2 24 09/01/2023    TSH 1.530 09/01/2023          Assessment/Plan:  1. Attention deficit hyperactivity disorder (ADHD), unspecified ADHD type  -     amphetamine-dextroamphetamine (ADDERALL) 30 MG tablet; 0.5 Orally tid, Disp-45, Disp-45 tablet, R-0Normal  -     amphetamine-dextroamphetamine (ADDERALL) 30 MG tablet; 0.5 Orally tid, Disp-45, Disp-45 tablet, R-0Normal  -     amphetamine-dextroamphetamine (ADDERALL) 30 MG tablet; 0.5 Orally tid, Disp-45, Disp-45 tablet, R-0Normal      Chronic. Stable. Refill Medications.   Patient has a good understanding of the potential risks and benefits of this medication. PDMP database reviewed today.       Dionisio Aragones T. Roseanne Reno, DO  Internal Medicine  [facility]  09/14/2023   Return in about 3 months (around 12/15/2023).

## 2023-09-15 ENCOUNTER — Encounter: Payer: PRIVATE HEALTH INSURANCE | Attending: Internal Medicine | Primary: Internal Medicine

## 2023-12-13 ENCOUNTER — Telehealth
Admit: 2023-12-13 | Discharge: 2023-12-13 | Payer: PRIVATE HEALTH INSURANCE | Attending: Internal Medicine | Primary: Internal Medicine

## 2023-12-13 DIAGNOSIS — F909 Attention-deficit hyperactivity disorder, unspecified type: Secondary | ICD-10-CM

## 2023-12-13 MED ORDER — AMPHETAMINE-DEXTROAMPHETAMINE 30 MG PO TABS
30 | ORAL_TABLET | ORAL | 0 refills | Status: AC
Start: 2023-12-13 — End: 2024-03-12

## 2023-12-13 MED ORDER — AMPHETAMINE-DEXTROAMPHETAMINE 30 MG PO TABS
30 | ORAL_TABLET | ORAL | 0 refills | Status: AC
Start: 2023-12-13 — End: 2024-02-10

## 2023-12-13 MED ORDER — AMPHETAMINE-DEXTROAMPHETAMINE 30 MG PO TABS
30 | ORAL_TABLET | ORAL | 0 refills | Status: AC
Start: 2023-12-13 — End: 2024-01-14

## 2023-12-13 NOTE — Progress Notes (Signed)
[facility], was evaluated through a synchronous (real-time) audio-video encounter. The patient (or guardian if applicable) is aware that this is a billable service, which includes applicable co-pays. This Virtual Visit was conducted with patient's (and/or legal guardian's) consent. Patient identification was verified, and a caregiver was present when appropriate.   The patient was located at Home: [address] [place] [facility]  [place] [address]  Provider was located at [employer] (Appt Dept): [address]  [place] [address]  Confirm you are appropriately licensed, registered, or certified to deliver care in the state where the patient is located as indicated above. If you are not or unsure, please re-schedule the visit: Yes, I confirm.        --Brandon Melnick, DO on 12/13/2023 at 8:46 AM    An electronic signature was used to authenticate this note.    Internal Medicine Visit Note- [facility]    Chief Complaint:  Chief Complaint   Patient presents with    Medication Check        PHQ-9 Total Score: 0 (12/13/2023  8:08 AM)       History of Present Illness:  Patient presents for 7-month med check.  ADHD is well-controlled on current Adderall regimen, reports no adverse side effects of medication.    Current Outpatient Medications   Medication Sig Dispense Refill    [START ON 02/10/2024] amphetamine-dextroamphetamine (ADDERALL) 30 MG tablet 0.5 Orally tid, Disp-45 45 tablet 0    [START ON 01/14/2024] amphetamine-dextroamphetamine (ADDERALL) 30 MG tablet 0.5 Orally tid, Disp-45 45 tablet 0    amphetamine-dextroamphetamine (ADDERALL) 30 MG tablet 0.5 Orally tid, Disp-45 45 tablet 0    drospirenone-ethinyl estradiol 3-0.03 MG TABS 1 tablet Orally Once a day 1 packet 12    aspirin-acetaminophen-caffeine (EXCEDRIN MIGRAINE) 250-250-65 MG per tablet       amphetamine-dextroamphetamine (ADDERALL, 30MG ,) 30 MG tablet 0.5 Orally tid 45 tablet 0     No current facility-administered  medications for this visit.        Past Medical History:   Diagnosis Date    ADHD (attention deficit hyperactivity disorder)     Allergic rhinitis     Deviated septum     Folliculitis     Headache     TBI (traumatic brain injury)     TMJ (dislocation of temporomandibular joint)       Past Surgical History:   Procedure Laterality Date    FACIAL COSMETIC SURGERY  10/2019    JAW AND NOSE    FRACTURE SURGERY        Family History   Problem Relation Age of Onset    Heart Disease Father           Social History       Tobacco History       Smoking Status  Never      Smokeless Tobacco Use  Never                   Social History     Substance and Sexual Activity   Alcohol Use Yes    Alcohol/week: 3.0 standard drinks of alcohol    Types: 3 Glasses of wine per week      Patient has no known allergies.     Review of Systems:  Review of Systems   All other systems reviewed and are negative.        Objective    There were no vitals filed  for this visit.  There is no height or weight on file to calculate BMI.   Physical Exam  Constitutional:       General: She is not in acute distress.     Appearance: Normal appearance.   Pulmonary:      Effort: No respiratory distress.   Neurological:      Mental Status: She is alert and oriented to person, place, and time.          Labs:  Lab Results   Component Value Date    WBC 4.3 09/01/2023    HGB 14.0 09/01/2023    HCT 41.4 09/01/2023    PLT 176 09/01/2023    CHOL 192 09/01/2023    TRIG 95 09/01/2023    HDL 87 09/01/2023    ALT 24 09/01/2023    AST 21 09/01/2023    NA 142 09/01/2023    K 3.6 09/01/2023    CL 105 09/01/2023    CREATININE 0.8 09/01/2023    BUN 5 (L) 09/01/2023    CO2 24 09/01/2023    TSH 1.530 09/01/2023          Assessment/Plan:  1. Attention deficit hyperactivity disorder (ADHD), unspecified ADHD type  -     amphetamine-dextroamphetamine (ADDERALL) 30 MG tablet; 0.5 Orally tid, Disp-45, Disp-45 tablet, R-0Normal  -     amphetamine-dextroamphetamine (ADDERALL) 30 MG tablet;  0.5 Orally tid, Disp-45, Disp-45 tablet, R-0Normal  -     amphetamine-dextroamphetamine (ADDERALL) 30 MG tablet; 0.5 Orally tid, Disp-45, Disp-45 tablet, R-0Normal    Chronic. Stable. Refill Medications.   Patient has a good understanding of the potential risks and benefits of this medication. PDMP database reviewed today.         Lisa Hermiz T. Roseanne Reno, DO  Internal Medicine  [facility]  12/13/2023   Return in about 3 months (around 03/12/2024).

## 2023-12-15 ENCOUNTER — Encounter: Payer: PRIVATE HEALTH INSURANCE | Attending: Internal Medicine | Primary: Internal Medicine

## 2023-12-18 ENCOUNTER — Encounter: Payer: PRIVATE HEALTH INSURANCE | Attending: Internal Medicine | Primary: Internal Medicine

## 2024-03-13 ENCOUNTER — Encounter: Payer: PRIVATE HEALTH INSURANCE | Attending: Internal Medicine | Primary: Internal Medicine

## 2024-03-14 ENCOUNTER — Ambulatory Visit
Admit: 2024-03-14 | Discharge: 2024-03-14 | Payer: PRIVATE HEALTH INSURANCE | Attending: Internal Medicine | Primary: Internal Medicine

## 2024-03-14 VITALS — BP 101/70 | HR 90 | Temp 97.60000°F | Resp 18 | Ht 69.0 in | Wt 132.6 lb

## 2024-03-14 DIAGNOSIS — F909 Attention-deficit hyperactivity disorder, unspecified type: Secondary | ICD-10-CM

## 2024-03-14 MED ORDER — DROSPIRENONE-ETHINYL ESTRADIOL 3-0.03 MG PO TABS
3-0.03 | PACK | ORAL | 12 refills | Status: AC
Start: 2024-03-14 — End: ?

## 2024-03-14 MED ORDER — AMPHETAMINE-DEXTROAMPHETAMINE 30 MG PO TABS
30 | ORAL_TABLET | ORAL | 0 refills | 30.00000 days | Status: DC
Start: 2024-03-14 — End: 2024-06-13

## 2024-03-14 NOTE — Progress Notes (Signed)
 Internal Medicine Visit Note- [facility]    Chief Complaint:  Chief Complaint   Patient presents with    Follow-up        No data recorded     History of Present Illness:  Patient presents for 2-month follow-up and medication refills.  ADHD is well-controlled on current Adderall regimen, reports no adverse side effects to the medication.    Requesting Nurtec samples today.    Current Outpatient Medications   Medication Sig Dispense Refill    drospirenone -ethinyl estradiol  3-0.03 MG TABS 1 tablet Orally Once a day 1 packet 12    [START ON 03/15/2024] amphetamine -dextroamphetamine  (ADDERALL) 30 MG tablet 0.5 Orally tid, Disp-45 45 tablet 0    [START ON 04/14/2024] amphetamine -dextroamphetamine  (ADDERALL) 30 MG tablet 0.5 Orally tid, Disp-45 45 tablet 0    [START ON 05/14/2024] amphetamine -dextroamphetamine  (ADDERALL, 30MG ,) 30 MG tablet 0.5 Orally tid 45 tablet 0    aspirin-acetaminophen-caffeine (EXCEDRIN MIGRAINE) 250-250-65 MG per tablet        No current facility-administered medications for this visit.        Past Medical History:   Diagnosis Date    ADHD (attention deficit hyperactivity disorder)     Allergic rhinitis     Deviated septum     Folliculitis     Headache     TBI (traumatic brain injury) (HCC)     TMJ (dislocation of temporomandibular joint)       Past Surgical History:   Procedure Laterality Date    FACIAL COSMETIC SURGERY  10/2019    JAW AND NOSE    FRACTURE SURGERY        Family History   Problem Relation Age of Onset    Heart Disease Father           Social History       Tobacco History       Smoking Status  Never      Smokeless Tobacco Use  Never                   Social History     Substance and Sexual Activity   Alcohol Use Yes    Alcohol/week: 3.0 standard drinks of alcohol    Types: 3 Glasses of wine per week      Patient has no known allergies.     Review of Systems:  Review of Systems   All other systems reviewed and are negative.        Objective    Vitals:    03/14/24 0851    BP: 101/70   BP Site: Left Upper Arm   Patient Position: Sitting   BP Cuff Size: Medium Adult   Pulse: 90   Resp: 18   Temp: 97.6 F (36.4 C)   TempSrc: Oral   SpO2: 98%   Weight: 60.1 kg (132 lb 9.6 oz)   Height: 1.753 m (5\' 9" )     Body mass index is 19.58 kg/m.   Physical Exam  Constitutional:       General: She is not in acute distress.     Appearance: Normal appearance.   Pulmonary:      Effort: No respiratory distress.   Neurological:      Mental Status: She is alert and oriented to person, place, and time.          Labs:  Lab Results   Component Value Date    WBC 4.3 09/01/2023    HGB 14.0 09/01/2023  HCT 41.4 09/01/2023    PLT 176 09/01/2023    CHOL 192 09/01/2023    TRIG 95 09/01/2023    HDL 87 09/01/2023    ALT 24 09/01/2023    AST 21 09/01/2023    NA 142 09/01/2023    K 3.6 09/01/2023    CL 105 09/01/2023    CREATININE 0.8 09/01/2023    BUN 5 (L) 09/01/2023    CO2 24 09/01/2023    TSH 1.530 09/01/2023          Assessment/Plan:  1. Attention deficit hyperactivity disorder (ADHD), unspecified ADHD type  -     amphetamine -dextroamphetamine  (ADDERALL) 30 MG tablet; 0.5 Orally tid, Disp-45, Disp-45 tablet, R-0Normal  -     amphetamine -dextroamphetamine  (ADDERALL) 30 MG tablet; 0.5 Orally tid, Disp-45, Disp-45 tablet, R-0Normal  -     amphetamine -dextroamphetamine  (ADDERALL, 30MG ,) 30 MG tablet; 0.5 Orally tid, Disp-45 tablet, R-0Normal    Nurtec samples provided.      Tamarah Bhullar T. Annette Barters, DO  Internal Medicine  [facility]  03/14/2024   Return in about 3 months (around 06/13/2024).

## 2024-06-13 ENCOUNTER — Telehealth
Admit: 2024-06-13 | Discharge: 2024-06-13 | Payer: PRIVATE HEALTH INSURANCE | Attending: Internal Medicine | Primary: Internal Medicine

## 2024-06-13 DIAGNOSIS — F909 Attention-deficit hyperactivity disorder, unspecified type: Principal | ICD-10-CM

## 2024-06-13 MED ORDER — AMPHETAMINE-DEXTROAMPHETAMINE 30 MG PO TABS
30 | ORAL_TABLET | ORAL | 0 refills | 30.00000 days | Status: DC
Start: 2024-06-13 — End: 2024-09-13

## 2024-06-13 NOTE — Progress Notes (Signed)
 [facility], was evaluated through a synchronous (real-time) audio-video encounter. The patient (or guardian if applicable) is aware that this is a billable service, which includes applicable co-pays. This Virtual Visit was conducted with patient's (and/or legal guardian's) consent. Patient identification was verified, and a caregiver was present when appropriate.   The patient was located at Home: [address] [place] V[place]  [place] [address]  Provider was located at [employer] (Appt Dept): [address],  [place] [address]  Confirm you are appropriately licensed, registered, or certified to deliver care in the state where the patient is located as indicated above. If you are not or unsure, please re-schedule the visit: Yes, I confirm.      Total time spent for this encounter: Not billed by time    --[name], DO on 06/13/2024 at 8:53 AM    An electronic signature was used to authenticate this note.    Internal Medicine Visit Note- [name]    Chief Complaint:  No chief complaint on file.       No data recorded     History of Present Illness:  Patient presents for 49-month follow-up and ADHD management.  ADHD is well-controlled on current Adderall regimen, reports no adverse side effects of medication.    Current Outpatient Medications   Medication Sig Dispense Refill    [START ON 08/14/2024] amphetamine -dextroamphetamine  (ADDERALL, 30MG ,) 30 MG tablet 0.5 Orally tid 45 tablet 0    [START ON 07/15/2024] amphetamine -dextroamphetamine  (ADDERALL) 30 MG tablet 0.5 Orally tid, Disp-45 45 tablet 0    [START ON 06/15/2024] amphetamine -dextroamphetamine  (ADDERALL) 30 MG tablet 0.5 Orally tid, Disp-45 45 tablet 0    drospirenone -ethinyl estradiol  3-0.03 MG TABS 1 tablet Orally Once a day 1 packet 12    aspirin-acetaminophen-caffeine (EXCEDRIN MIGRAINE) 250-250-65 MG per tablet        No current facility-administered medications for this visit.        Past Medical History:    Diagnosis Date    ADHD (attention deficit hyperactivity disorder)     Allergic rhinitis     Deviated septum     Folliculitis     Headache     TBI (traumatic brain injury) (HCC)     TMJ (dislocation of temporomandibular joint)       Past Surgical History:   Procedure Laterality Date    FACIAL COSMETIC SURGERY  10/2019    JAW AND NOSE    FRACTURE SURGERY        Family History   Problem Relation Age of Onset    Heart Disease Father           Social History       Tobacco History       Smoking Status  Never      Smokeless Tobacco Use  Never                   Social History     Substance and Sexual Activity   Alcohol Use Yes    Alcohol/week: 3.0 standard drinks of alcohol    Types: 3 Glasses of wine per week      Patient has no known allergies.     Review of Systems:  Review of Systems   All other systems reviewed and are negative.        Objective    There were no vitals filed for this visit.  There is no height or weight on file to  calculate BMI.   Physical Exam  Constitutional:       General: She is not in acute distress.     Appearance: Normal appearance.   Pulmonary:      Effort: No respiratory distress.   Neurological:      Mental Status: She is alert and oriented to person, place, and time.          Labs:  Lab Results   Component Value Date    WBC 4.3 09/01/2023    HGB 14.0 09/01/2023    HCT 41.4 09/01/2023    PLT 176 09/01/2023    CHOL 192 09/01/2023    TRIG 95 09/01/2023    HDL 87 09/01/2023    ALT 24 09/01/2023    AST 21 09/01/2023    NA 142 09/01/2023    K 3.6 09/01/2023    CL 105 09/01/2023    CREATININE 0.8 09/01/2023    BUN 5 (L) 09/01/2023    CO2 24 09/01/2023    TSH 1.530 09/01/2023          Assessment/Plan:  1. Attention deficit hyperactivity disorder (ADHD), unspecified ADHD type  -     amphetamine -dextroamphetamine  (ADDERALL, 30MG ,) 30 MG tablet; 0.5 Orally tid, Disp-45 tablet, R-0Normal  -     amphetamine -dextroamphetamine  (ADDERALL) 30 MG tablet; 0.5 Orally tid, Disp-45, Disp-45 tablet, R-0Normal  -      amphetamine -dextroamphetamine  (ADDERALL) 30 MG tablet; 0.5 Orally tid, Disp-45, Disp-45 tablet, R-0Normal    Chronic. Stable. Refill Medications.   Patient has a good understanding of the potential risks and benefits of this medication. PDMP database reviewed today.         Chanele Douglas T. Jackquline, DO  Internal Medicine  [name]  06/13/2024   Return in about 3 months (around 09/13/2024).

## 2024-09-12 ENCOUNTER — Encounter: Payer: PRIVATE HEALTH INSURANCE | Attending: Internal Medicine | Primary: Internal Medicine

## 2024-09-13 ENCOUNTER — Ambulatory Visit
Admit: 2024-09-13 | Discharge: 2024-09-13 | Payer: PRIVATE HEALTH INSURANCE | Attending: Internal Medicine | Primary: Internal Medicine

## 2024-09-13 MED ORDER — AMPHETAMINE-DEXTROAMPHETAMINE 30 MG PO TABS
30 | ORAL_TABLET | ORAL | 0 refills | 30.00000 days | Status: DC
Start: 2024-09-13 — End: 2024-12-13

## 2024-09-13 NOTE — Progress Notes (Signed)
 "Internal Medicine Visit Note- [name]    Chief Complaint:  Chief Complaint   Patient presents with    Medication Refill     Medication check         No data recorded     History of Present Illness:  Patient presents for 30-month follow-up and medication refills.  ADHD is well-controlled on current Adderall regimen, reports no adverse side effects of the medication.  Request additional samples of Nurtec.    Current Outpatient Medications   Medication Sig Dispense Refill    amphetamine -dextroamphetamine  (ADDERALL, 30MG ,) 30 MG tablet 0.5 Orally tid 45 tablet 0    [START ON 10/13/2024] amphetamine -dextroamphetamine  (ADDERALL) 30 MG tablet 0.5 Orally tid, Disp-45 45 tablet 0    [START ON 11/12/2024] amphetamine -dextroamphetamine  (ADDERALL) 30 MG tablet 0.5 Orally tid, Disp-45 45 tablet 0    drospirenone -ethinyl estradiol  3-0.03 MG TABS 1 tablet Orally Once a day 1 packet 12    aspirin-acetaminophen-caffeine (EXCEDRIN MIGRAINE) 250-250-65 MG per tablet        No current facility-administered medications for this visit.        Past Medical History:   Diagnosis Date    ADHD (attention deficit hyperactivity disorder)     Allergic rhinitis     Deviated septum     Folliculitis     Headache     TBI (traumatic brain injury) (HCC)     TMJ (dislocation of temporomandibular joint)       Past Surgical History:   Procedure Laterality Date    FACIAL COSMETIC SURGERY  10/2019    JAW AND NOSE    FRACTURE SURGERY        Family History   Problem Relation Age of Onset    Heart Disease Father           Social History       Tobacco History       Smoking Status  Never      Smokeless Tobacco Use  Never                   Social History     Substance and Sexual Activity   Alcohol Use Yes    Alcohol/week: 3.0 standard drinks of alcohol    Types: 3 Glasses of wine per week      Patient has no known allergies.     Review of Systems:  Review of Systems   All other systems reviewed and are negative.        Objective    Vitals:     09/13/24 0933   BP: 108/88   BP Site: Left Upper Arm   Patient Position: Sitting   BP Cuff Size: Medium Adult   Pulse: 81   Resp: 21   Temp: 98.6 F (37 C)   TempSrc: Oral   SpO2: 100%   Weight: 57.8 kg (127 lb 6.4 oz)   Height: 1.753 m (5' 9)     Body mass index is 18.81 kg/m.   Physical Exam  Constitutional:       General: She is not in acute distress.     Appearance: Normal appearance.   Pulmonary:      Effort: No respiratory distress.   Neurological:      Mental Status: She is alert and oriented to person, place, and time.          Labs:  Lab Results   Component Value Date    WBC 4.3 09/01/2023  HGB 14.0 09/01/2023    HCT 41.4 09/01/2023    PLT 176 09/01/2023    CHOL 192 09/01/2023    TRIG 95 09/01/2023    HDL 87 09/01/2023    ALT 24 09/01/2023    AST 21 09/01/2023    NA 142 09/01/2023    K 3.6 09/01/2023    CL 105 09/01/2023    CREATININE 0.8 09/01/2023    BUN 5 (L) 09/01/2023    CO2 24 09/01/2023    TSH 1.530 09/01/2023          Assessment/Plan:  1. Attention deficit hyperactivity disorder (ADHD), unspecified ADHD type  -     amphetamine -dextroamphetamine  (ADDERALL, 30MG ,) 30 MG tablet; 0.5 Orally tid, Disp-45 tablet, R-0Normal  -     amphetamine -dextroamphetamine  (ADDERALL) 30 MG tablet; 0.5 Orally tid, Disp-45, Disp-45 tablet, R-0Normal  -     amphetamine -dextroamphetamine  (ADDERALL) 30 MG tablet; 0.5 Orally tid, Disp-45, Disp-45 tablet, R-0Normal  2. Episodic migraine    ADHD-  Chronic. Stable. Refill Medications.   Patient has a good understanding of the potential risks and benefits of this medication. PDMP database reviewed today.     Episodic migraine-  Chronic.  Controlled with [place] as needed.  Samples provided today.      Tasheka Houseman T. Jackquline, DO  Internal Medicine  [name]  09/13/2024   Return in about 3 months (around 12/14/2024).       "

## 2024-12-13 ENCOUNTER — Telehealth
Admit: 2024-12-13 | Discharge: 2024-12-13 | Payer: PRIVATE HEALTH INSURANCE | Attending: Internal Medicine | Primary: Internal Medicine

## 2024-12-13 DIAGNOSIS — F909 Attention-deficit hyperactivity disorder, unspecified type: Principal | ICD-10-CM

## 2024-12-13 MED ORDER — AMPHETAMINE-DEXTROAMPHETAMINE 30 MG PO TABS
30 | ORAL_TABLET | ORAL | 0 refills | 30.00000 days | Status: AC
Start: 2024-12-13 — End: 2025-03-13

## 2024-12-13 MED ORDER — AMPHETAMINE-DEXTROAMPHETAMINE 30 MG PO TABS
30 | ORAL_TABLET | ORAL | 0 refills | 30.00000 days | Status: AC
Start: 2024-12-13 — End: 2025-01-13

## 2024-12-13 NOTE — Progress Notes (Signed)
 Lisa Velez, was evaluated through a synchronous (real-time) audio-video encounter. The patient (or guardian if applicable) is aware that this is a billable service, which includes applicable co-pays. This Virtual Visit was conducted with patient's (and/or legal guardian's) consent. Patient identification was verified, and a caregiver was present when appropriate.   The patient was located at Home: [address]  [place] [address]  Provider was located at Facility (Appt Dept): [address]  [place] [address]  Confirm you are appropriately licensed, registered, or certified to deliver care in the state where the patient is located as indicated above. If you are not or unsure, please re-schedule the visit: Yes, I confirm.      Total time spent for this encounter: Not billed by time    --Lisa Debby Rattler, DO on 12/13/2024 at 5:24 PM    An electronic signature was used to authenticate this note.    Internal Medicine Visit Note- [facility]    Chief Complaint:  No chief complaint on file.       PHQ-9 Total Score: (Patient-Rptd) 0 (12/13/2024  8:00 AM)       History of Present Illness:  Patient presents for routine follow-up and medication refills.  ADHD is well-controlled on current Adderall regimen reports no adverse side effects to medication    Current Outpatient Medications   Medication Sig Dispense Refill    [START ON 02/10/2025] amphetamine -dextroamphetamine  (ADDERALL, 30MG ,) 30 MG tablet 0.5 Orally tid 45 tablet 0    [START ON 01/12/2025] amphetamine -dextroamphetamine  (ADDERALL) 30 MG tablet 0.5 Orally tid, Disp-45 45 tablet 0    amphetamine -dextroamphetamine  (ADDERALL) 30 MG tablet 0.5 Orally tid, Disp-45 45 tablet 0    drospirenone -ethinyl estradiol  3-0.03 MG TABS 1 tablet Orally Once a day 1 packet 12    aspirin-acetaminophen-caffeine (EXCEDRIN MIGRAINE) 250-250-65 MG per tablet        No current facility-administered medications for this visit.        Past  Medical History:   Diagnosis Date    ADHD (attention deficit hyperactivity disorder)     Allergic rhinitis     Deviated septum     Folliculitis     Headache     TBI (traumatic brain injury) (HCC)     TMJ (dislocation of temporomandibular joint)       Past Surgical History:   Procedure Laterality Date    FACIAL COSMETIC SURGERY  10/2019    JAW AND NOSE    FRACTURE SURGERY        Family History   Problem Relation Age of Onset    Heart Disease Father           Social History       Tobacco History       Smoking Status  Never      Smokeless Tobacco Use  Never                   Social History     Substance and Sexual Activity   Alcohol Use Yes    Alcohol/week: 3.0 standard drinks of alcohol    Types: 3 Glasses of wine per week      Patient has no known allergies.     Review of Systems:  Review of Systems   All other systems reviewed and are negative.        Objective    There were no vitals filed for this visit.  There is no height or weight  on file to calculate BMI.   Physical Exam  Constitutional:       General: She is not in acute distress.     Appearance: Normal appearance.   Pulmonary:      Effort: No respiratory distress.   Neurological:      Mental Status: She is alert and oriented to person, place, and time.          Labs:  Lab Results   Component Value Date    WBC 4.3 09/01/2023    HGB 14.0 09/01/2023    HCT 41.4 09/01/2023    PLT 176 09/01/2023    CHOL 192 09/01/2023    TRIG 95 09/01/2023    HDL 87 09/01/2023    ALT 24 09/01/2023    AST 21 09/01/2023    NA 142 09/01/2023    K 3.6 09/01/2023    CL 105 09/01/2023    CREATININE 0.8 09/01/2023    BUN 5 (L) 09/01/2023    CO2 24 09/01/2023    TSH 1.530 09/01/2023          Assessment/Plan:  1. Attention deficit hyperactivity disorder (ADHD), unspecified ADHD type  -     amphetamine -dextroamphetamine  (ADDERALL, 30MG ,) 30 MG tablet; 0.5 Orally tid, Disp-45 tablet, R-0Normal  -     amphetamine -dextroamphetamine  (ADDERALL) 30 MG tablet; 0.5 Orally tid, Disp-45, Disp-45  tablet, R-0Normal  -     amphetamine -dextroamphetamine  (ADDERALL) 30 MG tablet; 0.5 Orally tid, Disp-45, Disp-45 tablet, R-0Normal      Chronic. Stable. Refill Medications.   Patient has a good understanding of the potential risks and benefits of this medication. PDMP database reviewed today.       Lisa Velez T. Jackquline, DO  Internal Medicine  [facility]  12/13/2024   Return in about 3 months (around 03/13/2025).
